# Patient Record
Sex: Female | Born: 1985 | Race: White | Hispanic: No | Marital: Single
Health system: Midwestern US, Community
[De-identification: ages and names within clinical notes are randomized; demographics above are authoritative.]

---

## 2010-02-23 ENCOUNTER — Emergency Department (HOSPITAL_BASED_OUTPATIENT_CLINIC_OR_DEPARTMENT_OTHER): Admission: EM | Admit: 2010-02-23 | Discharge: 2010-02-23 | Payer: Self-pay | Admitting: Emergency Medicine

## 2010-05-23 LAB — URINALYSIS WITH MICROSCOPIC
Bilirubin, Urine: NEGATIVE
CASTS 2: NONE SEEN /lpf
Casts UA: NONE SEEN /lpf
Crystals, UA: NONE SEEN
Glucose, Ur: NEGATIVE mg/dl
Ketones, Urine: 40 — AB
Leukocyte Esterase, Urine: NEGATIVE
MISCELLANEOUS 2: NONE SEEN
Nitrite, Urine: NEGATIVE
Renal Epithelial, UA: NONE SEEN
Specific Gravity, Urine: 1.029 (ref 1.002–1.030)
Urobilinogen, Urine: 0.2 eu/dl (ref 0.0–1.0)
Yeast, UA: NONE SEEN
pH, UA: 6 (ref 5.0–9.0)

## 2010-05-23 LAB — CBC
Basophils: 0.2 % (ref 0.0–3.0)
Eosinophils: 0.1 % (ref 0.0–4.0)
Hematocrit: 47.6 % — ABNORMAL HIGH (ref 37.0–47.0)
Hemoglobin: 16 gm/dl (ref 12.0–16.0)
Lymphocytes: 3.8 % — ABNORMAL LOW (ref 15.0–47.0)
MCH: 31.9 pg — ABNORMAL HIGH (ref 27.0–31.0)
MCHC: 33.7 gm/dl (ref 33.0–37.0)
MCV: 94.9 fL (ref 81.0–99.0)
MPV: 9.4 mcm (ref 7.4–10.4)
Monocytes: 4.3 % (ref 0.0–12.0)
Nucleated Red Blood Cells: 0 /100 wbc
Platelets: 254 10*3/uL (ref 130–400)
RBC Morphology: NORMAL
RBC: 5.02 10*6/uL (ref 4.20–5.40)
RDW: 13.5 % (ref 11.5–14.5)
Seg Neutrophils: 91.6 % — ABNORMAL HIGH (ref 43.0–75.0)
WBC: 13.5 10*3/uL — ABNORMAL HIGH (ref 4.8–10.8)

## 2010-05-23 LAB — BASIC METABOLIC PANEL WITHOUT CALCIUM
BUN, WHOLE BLOOD: 13 mg/dl (ref 7–22)
CO2, WHOLE BLOOD: 19 meq/l — ABNORMAL LOW (ref 23–33)
CREATININE, WHOLE BLOOD: 0.7 mg/dl (ref 0.5–1.2)
Chloride, Whole Blood: 107 meq/l (ref 98–111)
Glucose, Whole Blood: 111 mg/dl — ABNORMAL HIGH (ref 70–108)
Potassium, Whole Blood: 4.3 meq/l (ref 3.5–5.2)
Sodium, Whole Blood: 142 meq/l (ref 135–145)

## 2010-05-23 LAB — HEPATIC FUNCTION PANEL
ALT: 12 U/L (ref 11–66)
AST: 17 U/L (ref 12–45)
Albumin: 4.4 gm/dl (ref 3.5–5.1)
Alkaline Phosphatase: 77 U/L (ref 38–126)
Bilirubin, Direct: 0.2 mg/dl (ref 0.0–0.3)
Bilirubin, Total: 0.5 mg/dl (ref 0.3–1.2)
Total Protein: 7.2 gm/dl (ref 6.1–8.0)

## 2010-05-23 LAB — ANION GAP: Anion Gap: 19.7 (ref 10.0–20.0)

## 2010-05-23 LAB — OSMOLALITY: Osmolality Calc: 283 (ref 275–300)

## 2010-05-23 LAB — HCG, SERUM, QUALITATIVE: Preg, Serum: NEGATIVE

## 2010-05-24 LAB — GLOMERULAR FILTRATION RATE, ESTIMATED: Est, Glom Filt Rate: 89 mL/min/{1.73_m2} — AB

## 2010-05-24 LAB — COMPREHENSIVE METABOLIC PANEL
ALT: 10 U/L — ABNORMAL LOW (ref 11–66)
AST: 16 U/L (ref 12–45)
Albumin: 3.9 gm/dl (ref 3.5–5.1)
Alkaline Phosphatase: 68 U/L (ref 38–126)
BUN: 7 mg/dl (ref 7–22)
Bilirubin, Total: 0.4 mg/dl (ref 0.3–1.2)
CO2: 25 meq/l (ref 23–33)
Calcium: 8.9 mg/dl (ref 8.5–10.5)
Chloride: 113 meq/l — ABNORMAL HIGH (ref 98–111)
Creatinine: 0.8 mg/dl (ref 0.4–1.2)
Glucose: 91 mg/dl (ref 70–108)
Potassium: 4.1 meq/l (ref 3.5–5.2)
Sodium: 143 meq/l (ref 135–145)
Total Protein: 6.4 gm/dl (ref 6.1–8.0)

## 2010-05-24 LAB — CBC
Basophils: 0.2 % (ref 0.0–3.0)
Eosinophils: 0.6 % (ref 0.0–4.0)
Hematocrit: 39.8 % (ref 37.0–47.0)
Hemoglobin: 13.3 gm/dl (ref 12.0–16.0)
Lymphocytes: 27.4 % (ref 15.0–47.0)
MCH: 31.9 pg — ABNORMAL HIGH (ref 27.0–31.0)
MCHC: 33.3 gm/dl (ref 33.0–37.0)
MCV: 95.6 fL (ref 81.0–99.0)
MPV: 9.6 mcm (ref 7.4–10.4)
Monocytes: 11.9 % (ref 0.0–12.0)
Nucleated Red Blood Cells: 0 /100 wbc
Platelets: 187 10*3/uL (ref 130–400)
RBC Morphology: NORMAL
RBC: 4.16 10*6/uL — ABNORMAL LOW (ref 4.20–5.40)
RDW: 13.6 % (ref 11.5–14.5)
Seg Neutrophils: 59.9 % (ref 43.0–75.0)
WBC: 5.6 10*3/uL (ref 4.8–10.8)

## 2010-05-24 LAB — MAGNESIUM: Magnesium: 2.2 mg/dl (ref 1.6–2.4)

## 2010-09-21 LAB — PREGNANCY, URINE: Pregnancy, Urine: POSITIVE

## 2010-09-26 LAB — CHLAMYDIA CULTURE: CHLAMYDIA CULTURE: NEGATIVE

## 2010-09-26 LAB — CULTURE, GONOCOCCUS: Culture, Gonorrhoeae: NEGATIVE

## 2010-09-26 LAB — RPR: RPR: NONREACTIVE

## 2010-09-26 LAB — HIV SCREEN: HIV-1/HIV-2 Ab: NEGATIVE

## 2010-09-26 LAB — TYPE AND SCREEN

## 2010-09-26 LAB — HEPATITIS B SURFACE ANTIGEN OBSTETRIC PANEL: Hepatitis B Surface Ag: NEGATIVE

## 2010-09-26 LAB — RUBELLA ANTIBODY, IGG: Rubella Antibody, IgG: IMMUNE

## 2010-10-05 NOTE — L&D Delivery Note (Signed)
Department of Obstetrics and Gynecology  Spontaneous Vaginal Delivery Note      Pt Name: Jenny Ramirez  MRN: [id number] Acct #: [id number]  Date of Birth: [birth year]  Procedure Performed By: Burnard Leigh. Shahil Speegle, MD        Pre-operative Diagnosis:  Term pregnancy or Spontaneous labor    Post-operative Diagnosis: Same, delivered.    Procedure:  Spontaneous vaginal delivery    Surgeon:  Bea Graff    Information for the patient's newborn:  Amala, Petion [[id number]]   Sex: Female (Simultaneous filing. User may not have seen previous data.)  Birthweight: 6 lb 11.8 oz (3.055 kg)  One Minute Apgar: 9   Five Minute Apgar: 9       Anesthesia:  none    Estimated blood loss:     Specimen:  Placenta not sent to pathology     Complications:  none    Condition:  infant stable to general nursery and mother stable    Details of Procedure:   The patient is a 25 y.o. female at [redacted]w[redacted]d   OB History     Grav Para Term Preterm Abortions TAB SAB Ect Mult Living    2 1 1       1        who was admitted for latent labor. She received the following interventions: ARBOW and IV Pitocin augmentationThe patient progressed well,did receive an epidural, became complete and started to push. She was placed in the dorsal lithotomy position and prepped. She delivered the vertex over an intact perineum The nose and mouth were suctioned with bulb suction and the rest of the infant delivered atraumatically, placed on mother abdomen.Cord was clamped and cut and infant handed off to the waiting nurse for evaluation. The placenta delivered intact, whole and that the umbilical cord had three vessels noted. The perineum and vagina were explored and no lacerations were found   Delivery Summary:  Labor & Delivery Summary  Rupture Date: 05/09/11  Rupture Time: 0659    Boy apgars 9,9 6#11    Ren Grasse L. Lauretta Sallas

## 2010-10-09 ENCOUNTER — Inpatient Hospital Stay: Admit: 2010-10-09 | Discharge: 2010-10-11 | Disposition: A | Discharge status: Home / Self Care

## 2010-10-09 LAB — ANION GAP: Anion Gap: 16.5 (ref 10.0–20.0)

## 2010-10-09 LAB — BASIC METABOLIC PANEL WITHOUT CALCIUM
BUN, WHOLE BLOOD: 9 mg/dl (ref 7–22)
CO2, WHOLE BLOOD: 20 meq/l — ABNORMAL LOW (ref 23–33)
CREATININE, WHOLE BLOOD: 0.5 mg/dl (ref 0.5–1.2)
Chloride, Whole Blood: 107 meq/l (ref 98–111)
Glucose, Whole Blood: 96 mg/dl (ref 70–108)
Potassium, Whole Blood: 3.9 meq/l (ref 3.5–5.2)
Sodium, Whole Blood: 138 meq/l (ref 135–145)

## 2010-10-09 LAB — HEPATIC FUNCTION PANEL
ALT: 14 U/L (ref 11–66)
AST: 14 U/L (ref 12–45)
Albumin: 4.5 gm/dl (ref 3.5–5.1)
Alkaline Phosphatase: 57 U/L (ref 38–126)
Bilirubin, Direct: 0.1 mg/dl (ref 0.0–0.3)
Bilirubin, Total: 0.4 mg/dl (ref 0.3–1.2)
Total Protein: 7.4 gm/dl (ref 6.1–8.0)

## 2010-10-09 LAB — CBC
Basophils: 0.1 % (ref 0.0–3.0)
Eosinophils: 0.1 % (ref 0.0–4.0)
Hematocrit: 41.8 % (ref 37.0–47.0)
Hemoglobin: 14.2 gm/dl (ref 12.0–16.0)
Lymphocytes: 7.6 % — ABNORMAL LOW (ref 15.0–47.0)
MCH: 32.2 pg — ABNORMAL HIGH (ref 27.0–31.0)
MCHC: 34 gm/dl (ref 33.0–37.0)
MCV: 94.6 fL (ref 81.0–99.0)
MPV: 10.1 mcm (ref 7.4–10.4)
Monocytes: 4.9 % (ref 0.0–12.0)
Nucleated Red Blood Cells: 0 /100 wbc
Platelets: 202 10*3/uL (ref 130–400)
RBC Morphology: NORMAL
RBC: 4.42 10*6/uL (ref 4.20–5.40)
RDW: 13.5 % (ref 11.5–14.5)
Seg Neutrophils: 87.3 % — ABNORMAL HIGH (ref 43.0–75.0)
WBC: 12 10*3/uL — ABNORMAL HIGH (ref 4.8–10.8)

## 2010-10-09 LAB — LIPASE: Lipase: 35 U/L (ref 5.6–51.3)

## 2010-10-09 LAB — OSMOLALITY: Osmolality Calc: 274 — ABNORMAL LOW (ref 275–300)

## 2010-12-22 LAB — URINALYSIS, ROUTINE W REFLEX MICROSCOPIC
Ketones, ur: NEGATIVE mg/dL
Leukocytes, UA: NEGATIVE
Nitrite: NEGATIVE
Protein, ur: 30 mg/dL — AB
pH: 7 (ref 5.0–8.0)

## 2010-12-22 LAB — URINE MICROSCOPIC-ADD ON

## 2010-12-22 LAB — BASIC METABOLIC PANEL
BUN: 7 mg/dL (ref 6–23)
Calcium: 9.5 mg/dL (ref 8.4–10.5)
Creatinine, Ser: 1 mg/dL (ref 0.4–1.2)
GFR calc non Af Amer: >60 mL/min (ref >60)
Glucose, Bld: 83 mg/dL (ref 70–99)
Potassium: 4.1 mEq/L (ref 3.5–5.1)

## 2010-12-22 LAB — CBC
HCT: 39.6 % (ref 36.0–46.0)
Platelets: 252 10*3/uL (ref 150–400)
RDW: 11.6 % (ref 11.5–15.5)
WBC: 8.6 10*3/uL (ref 4.0–10.5)

## 2010-12-22 LAB — DIFFERENTIAL
Basophils Absolute: 0.1 10*3/uL (ref 0.0–0.1)
Eosinophils Relative: 1 % (ref 0–5)
Lymphocytes Relative: 39 % (ref 12–46)
Neutrophils Relative %: 53 % (ref 43–77)

## 2011-03-27 ENCOUNTER — Observation Stay: Admit: 2011-03-27 | Disposition: A | Discharge status: Home / Self Care

## 2011-03-27 LAB — URINE WITH REFLEXED MICRO
Bilirubin Urine: NEGATIVE
CASTS 2: NONE SEEN /lpf
Casts UA: NONE SEEN /lpf
Crystals, UA: NONE SEEN
Glucose, Ur: NEGATIVE mg/dl
Ketones, Urine: NEGATIVE
Leukocyte Esterase, Urine: NEGATIVE
MISCELLANEOUS 2: NONE SEEN
Nitrite, Urine: NEGATIVE
Protein, UA: NEGATIVE
Renal Epithelial, UA: NONE SEEN
Specific Gravity, Urine: 1.019 (ref 1.002–1.030)
Urobilinogen, Urine: 0.2 eu/dl (ref 0.0–1.0)
Yeast, UA: NONE SEEN
pH, UA: 6.5 (ref 5.0–9.0)

## 2011-03-27 LAB — FETAL FIBRONECTIN: Fetal Fibronectin: NEGATIVE

## 2011-03-27 NOTE — Discharge Instructions (Signed)
Labor and Delivery Education for Discharge [[facility]]      Reminder to Patient   Please bring all teaching sheets and discharge information with you if you return to the hospital or the physician's office/clinic for follow-up care. If you have any questions, please call: "Call-A-Nurse" [phone] or [phone].    Call Your Doctor if Any of the Following Occurs   1. Leaking fluid from vagina with or without contractions  2. Bright red vaginal bleeding occurs that is as heavy as or heavier than a period  3. Regular contractions are longer, stronger, and closer together  4. Noticeable decreased fetal movement  5. Elevated temperature >100.5F and chills  6. Blurred vision, spots before eyes, unrelievable headache, severe facial swelling, or upper abdominal pain  Contact physician or hospital OB unit if signs of premature labor occur at ?36 weeks  1. Persistent or rhythmic low back pain that feels different than you are used to  2. Menstrual like cramps  3. Intestinal cramps with or without diarrhea  4. Pelvic pressure or rhythmic tightening that feels different than you are used to  5. Watery discharge or a gush of fluid from your vagina  6. Vaginal bleeding as heavy as a period  General Information     Difference between:  False Labor True Labor   1. Contractions often are irregular and don't consistently get closer together (called Braxton-Hicks contractions) 1. Contractions come at regular intervals and, as time goes on, get closer together.   2. Contractions may often stop when you rest or with a position change. 2. Contractions continue despite movement or walking. Contractions get stronger and closer together with time.   3. Often felt in the lower abdomen. 3. Usually felt in back coming around to the front.     Reviewed Dates   02/13/2006  Document developed by   Labor and Delivery Discharge Education Form  Disclaimer: We want you to understand more clearly each of the health conditions and procedures you  may have. This patient leaflet is a summary of useful information to help you gain a better understanding of these health topics. Other information about this condition or procedure may be important for you to know. Please talk with your healthcare provider for more information about your special health needs.

## 2011-03-28 NOTE — Progress Notes (Signed)
[facility]                                    [place], [place]                                 FETAL MONITORING REPORT     PATIENT NAME: Jenny Ramirez, Jenny Ramirez.            DOB:  1986-05-19  MEDICAL RECORD NO: [id number]                 ROOM: 5C 0002  ACCOUNT NO: [id number]                          DATE: 03/27/2011  PHYSICIAN: Lynnell Catalan, M.D.        Fetal monitoring was performed.  It showed good beat-to-beat variability,  good reactivity.     IMPRESSION of reactive fetal monitor strip was made.              Lynnell Catalan, M.D.           D: 03/28/2011 13:34                                    T: 03/28/2011 19:15  rm     CC:  Lynnell Catalan, M.D.  [address]  [place], [place] [address]

## 2011-04-11 ENCOUNTER — Inpatient Hospital Stay: Admit: 2011-04-11 | Discharge: 2011-04-11 | Disposition: A | Discharge status: Home / Self Care

## 2011-04-11 NOTE — ED Provider Notes (Signed)
Patient is a 25 y.o. female presenting with pharyngitis. The history is provided by the patient.   Pharyngitis  This is a new problem. The current episode started more than 2 days ago. The problem occurs constantly. The problem has not changed since onset.Pertinent negatives include no chest pain, no abdominal pain, no headaches and no shortness of breath. The symptoms are aggravated by swallowing. The symptoms are relieved by nothing. She has tried water for the symptoms.       Review of Systems   Constitutional: Negative.  Negative for fever.   HENT: Positive for sore throat.    Eyes: Negative.    Respiratory: Positive for cough. Negative for shortness of breath.    Cardiovascular: Negative.  Negative for chest pain.   Gastrointestinal: Negative.  Negative for abdominal pain.   Genitourinary: Negative.    Musculoskeletal: Negative.    Skin: Negative.    Neurological: Negative.  Negative for headaches.   Hematological: Negative.    Psychiatric/Behavioral: Negative.        Physical Exam   Nursing note and vitals reviewed.  Constitutional: She is oriented to person, place, and time. She appears well-developed and well-nourished.   HENT:   Head: Normocephalic and atraumatic.   Right Ear: External ear normal.   Left Ear: External ear normal.   Nose: Nose normal.   Mouth/Throat: Oropharynx is clear and moist.        Positive mucous PND   Eyes: Conjunctivae are normal. Pupils are equal, round, and reactive to light. Right eye exhibits no discharge. Left eye exhibits no discharge.   Neck: Normal range of motion. Neck supple.   Cardiovascular: Normal rate, regular rhythm, normal heart sounds and intact distal pulses.    No murmur heard.  Pulmonary/Chest: Effort normal and breath sounds normal. No respiratory distress. She has no wheezes. She has no rales. She exhibits no tenderness.   Abdominal: Soft. Bowel sounds are normal. She exhibits no mass. There is no tenderness.   Musculoskeletal: Normal range of motion.    Lymphadenopathy:     She has no cervical adenopathy.   Neurological: She is alert and oriented to person, place, and time.   Skin: Skin is warm and dry. No rash noted.   Psychiatric: She has a normal mood and affect. Her behavior is normal. Judgment and thought content normal.       Procedures    MDM    Labs      Radiology      EKG Interpretation.        Cordie Grice, CNP  04/11/11 1555

## 2011-04-11 NOTE — ED Notes (Signed)
Chart reviewed and findings by lpn accepted.     Harold Hedge, LPN  45/40/98 1191    Shawn Valrie Hart, RN  04/11/11 1625

## 2011-04-11 NOTE — Discharge Instructions (Signed)
Common Cold, Adult  An upper respiratory tract infection, or cold, is a viral infection of the air passages to the lung. Colds are contagious, especially during the first 3 or 4 days. Antibiotics cannot cure a cold. Cold germs are spread by coughs, sneezes, and hand to hand contact. A respiratory tract infection usually clears up in a few days, but some people may be sick for a week or two.  HOME CARE INSTRUCTIONS   Only take over-the-counter or prescription medicines for pain, discomfort, or fever as directed by your caregiver.    Be careful not to blow your nose too hard. This may cause a nosebleed.    Use a cool-mist humidifier (vaporizer) to increase air moisture. This will make it easier for you to breath. Do not use hot steam.    Rest as much as possible and get plenty of sleep.    Wash your hands often, especially after you blow your nose. Cover your mouth and nose with a tissue when you sneeze or cough.    Drink at least 8 glasses of clear liquids every day, such as water, fruit juices, tea, clear soups, and carbonated beverages.   SEEK MEDICAL CARE IF:   An oral temperature above 102 F (38.9 C) develops, or as your caregiver suggests, which lasts 2 days or more, and is not controlled by medication.    You have a sore throat that gets worse or you see white or yellow spots in your throat.    Your cough gets worse or lasts more than 10 days.    You have a rash somewhere on your skin. You have large and tender lumps in your neck.    You have an earache or a headache.    You have thick, greenish or yellowish discharge from your nose.    You cough-up thick yellow, green, gray or bloody mucus (secretions).   SEEK IMMEDIATE MEDICAL CARE IF:  You have trouble breathing, chest pain, or your skin or nails look gray or blue.  MAKE SURE YOU:    Understand these instructions.    Will watch your condition.    Will get help right away if you are not doing well or get worse.   Document Released:  01/10/2003 Document Re-Released: 09/03/2008  [facility] Patient Information 2012 [place].    Antibiotic Non-Use,  Why You Were Not Given One  You or your child was seen today. Your caregiver felt that the infection or problem was not one that would be helped with an antibiotic.    Infections may be caused by viruses or bacteria. Only a caregiver can tell which one of these is the likely cause of an illness. A cold is the most common cause of infection in both adults and children. A cold is a virus. Antibiotic treatment will have no effect on a viral infection. Viruses can lead to many lost days of work caring for sick children and many missed days of school. Children may catch as many as 10 "colds" or "flus" per year during which they can be tearful, cranky and uncomfortable. The goal of treating a virus is aimed at keeping the ill person comfortable.  Antibiotics are medications used to help the body fight bacterial infections. There are relatively few types of bacteria that cause infections but there are hundreds of viruses. While both viruses and bacteria cause infection they are very different types of germs. A viral infection will typically go away by itself within 7 to 10  days. Bacterial infections may spread or get worse without antibiotic treatment.  Examples of bacterial infections are:   Sore throats (like strep throat or tonsillitis).    Infection in the lung (pneumonia).    Ear and skin infections.   Examples of viral infections are:   Colds or flus.    Most coughs and bronchitis.    Sore throats not caused by Strep.    Runny noses.   It is often best not to take an antibiotic when a viral infection is the cause of the problem. Antibiotics can kill off the helpful bacteria that we have inside our body and allow harmful bacteria to start growing. Antibiotics can cause side effects such as allergies, nausea, and diarrhea without helping to improve the symptoms of the viral infection.  Additionally, repeated uses of antibiotics can cause bacteria inside of our body to become resistant. That resistance can be passed onto harmful bacterial. The next time you have an infection it may be harder to treat if antibiotics are used when they are not needed. Not treating with antibiotics allows our own immune system to develop and take care of infections more efficiently. Also, antibiotics will work better for [place] when they are prescribed for bacterial infections.  Treatments for a child that is ill may include:   Give extra fluids throughout the day to stay hydrated.    Get plenty of rest.    Only give your child over-the-counter or prescription medicines for pain, discomfort, or fever as directed by your caregiver.    The use of a cool mist humidifier may help stuffy noses.    Cold medications if suggested by your caregiver.   Your caregiver may decide to start you on an antibiotic if:   The problem you were seen for today continues for a longer length of time than expected.    You develop a secondary bacterial infection.   SEEK MEDICAL CARE IF   Fever lasts longer than 5 days.    Symptoms continue to get worse after 5 - 7 days or become severe.    Difficulty in breathing develops.    Signs of dehydration develop (poor drinking, rare urinating, dark colored urine).    Changes in behavior or worsening tiredness (listlessness or lethargy).   Document Released: 11/30/2001 Document Re-Released: 07/19/2009  [facility] Patient Information 2012 [place].    Salt Water Gargle  This solution will help make your mouth and throat feel better.  HOME CARE INSTRUCTIONS   Mix 1 teaspoon (4.9cc) of salt in 8 ounces (236 ml) of warm water.    Gargle with this solution as much or often as you need or as directed. Swish and gargle gently if you have any sores or wounds in your mouth.    Do not swallow this mixture.   Document Released: 06/25/2004 Document Re-Released: 12/16/2009  [facility] Patient  Information 2012 [place].

## 2011-04-19 ENCOUNTER — Observation Stay: Admit: 2011-04-19 | Disposition: A | Discharge status: Home / Self Care

## 2011-04-19 LAB — CULTURE, STREP B SCREEN, VAGINAL/RECTAL: Group B Strep Culture: POSITIVE

## 2011-04-19 MED ORDER — ACETAMINOPHEN 325 MG PO TABS
325 | ORAL | Status: DC | PRN
Start: 2011-04-19 — End: 2011-04-19

## 2011-04-19 MED ORDER — ONDANSETRON HCL 4 MG/2ML IJ SOLN
4 | Freq: Four times a day (QID) | INTRAMUSCULAR | Status: DC | PRN
Start: 2011-04-19 — End: 2011-04-19

## 2011-04-19 MED ORDER — ACETAMINOPHEN 325 MG PO TABS
325 | ORAL | Status: DC | PRN
Start: 2011-04-19 — End: 2014-05-12

## 2011-04-19 MED ORDER — ONDANSETRON HCL 4 MG/2ML IJ SOLN
4 | Freq: Four times a day (QID) | INTRAMUSCULAR | Status: DC | PRN
Start: 2011-04-19 — End: 2014-05-12

## 2011-04-19 NOTE — Other (Signed)
G2 P1 admitted to [facility] with cramping, pain in lower abdomen, and sharp occasional pain in upper abdomen.  Pt states she has felt this way since yesterday (7/14) at 1200.  V/E done.  Cervix closed and soft.  PP floating.  GBS culture done and sent to lab.

## 2011-04-19 NOTE — Other (Signed)
Pt discharge instructions given and explained with pt verbalized understanding. Stated to do not use any over the counter medications for bowel movement relief until spoken to primary physician and advised to call office in A.M. If no bowel movement, pt verbalized understanding with no further questions.

## 2011-04-19 NOTE — Discharge Instructions (Signed)
Home Undelivered Discharge Instructions    After Discharge Orders:               Diet:  normal diet as tolerated     Rest: normal activity as tolerated    Other instructions: Do kick counts once a day on your baby. Choose the time of day your baby is most active. Get in a comfortable lying or sitting position and time how long it takes to feel 10 kicks, twists, turns, swishes, or rolls. Call your physician or midwife if there have not been 10 kicks in 1 hours    Call physician or midwife, return to Labor and Delivery, call 911, or go to the nearest Emergency Room if: increased leakage or fluid, contractions more than  5 per  20 minutes, decreased fetal movement, persistent low back pain or cramping, bleeding from vaginal area, difficulty urinating, pain with urination, difficulty breathing, new calf pain, persistent headache or vision change          Labor and Delivery Education for Discharge [[facility]]      Reminder to Patient   Please bring all teaching sheets and discharge information with you if you return to the hospital or the physician's office/clinic for follow-up care. If you have any questions, please call: "Call-A-Nurse" [phone] or [phone].    Call Your Doctor if Any of the Following Occurs   1. Leaking fluid from vagina with or without contractions  2. Bright red vaginal bleeding occurs that is as heavy as or heavier than a period  3. Regular contractions are longer, stronger, and closer together  4. Noticeable decreased fetal movement  5. Elevated temperature >100.60F and chills  6. Blurred vision, spots before eyes, unrelievable headache, severe facial swelling, or upper abdominal pain  Contact physician or hospital OB unit if signs of premature labor occur at ?36 weeks  1. Persistent or rhythmic low back pain that feels different than you are used to  2. Menstrual like cramps  3. Intestinal cramps with or without diarrhea  4. Pelvic pressure or rhythmic tightening that feels different than  you are used to  5. Watery discharge or a gush of fluid from your vagina  6. Vaginal bleeding as heavy as a period  General Information     Difference between:  False Labor True Labor   1. Contractions often are irregular and don't consistently get closer together (called Braxton-Hicks contractions) 1. Contractions come at regular intervals and, as time goes on, get closer together.   2. Contractions may often stop when you rest or with a position change. 2. Contractions continue despite movement or walking. Contractions get stronger and closer together with time.   3. Often felt in the lower abdomen. 3. Usually felt in back coming around to the front.     Reviewed Dates   02/13/2006  Document developed by   Labor and Delivery Discharge Education Form  Disclaimer: We want you to understand more clearly each of the health conditions and procedures you may have. This patient leaflet is a summary of useful information to help you gain a better understanding of these health topics. Other information about this condition or procedure may be important for you to know. Please talk with your healthcare provider for more information about your special health needs.

## 2011-04-23 NOTE — Progress Notes (Signed)
[facility]                                    [place], [place]                                     OUTPATIENT NOTE     PATIENT NAME: Jenny Ramirez, Jenny Ramirez.            DOB:  January 23, 1986  MEDICAL RECORD NO: [id number]                 ROOM: 5C 0006  ACCOUNT NO: [id number]                          DATE: 04/19/2011  PHYSICIAN: Lorin Picket C. Enijah Furr, M.D.        NOTE:  The patient presented to Labor and Delivery at 35 weeks complaining of  contractions, she was found to be in false labor, she had a nonstress test  that was read as reactive.                 Charlayne Vultaggio C. Catalina Lunger, M.D.           D: 04/23/2011 16:27                                    T: 04/24/2011 02:20  js     CC:  Nehemias Sauceda C. Catalina Lunger, M.D.              Lynnell Catalan, M.D.  [address]                  [address]  [place], [place] [address]                        [place], [place] [address]

## 2011-05-08 NOTE — Other (Signed)
Contr. toco adjusted and rezeroed.

## 2011-05-08 NOTE — H&P (Signed)
Jenny Ramirez is a 25 y.o. female patient.  No diagnosis found.  Past Medical History   Diagnosis Date   . Abnormal Pap smear 2010     OB History     Grav Para Term Preterm Abortions TAB SAB Ect Mult Living    2 1 1       1         [redacted]w[redacted]d  Estimated Date of Delivery: 05/17/11  No Known Allergies  Active Problems:   * No active hospital problems. *     Blood pressure 129/59, pulse 87, temperature 97.8 F (36.6 C), temperature source Oral, resp. rate 18, height 5\' 7"  (1.702 m), weight 212 lb (96.163 kg), SpO2 96.00%, not currently breastfeeding.    Maternal Medical History:   Reason for admission: Reason for admission: contractions.   Contractions: Onset was 13-24 hours ago.    Frequency: irregular.    Perceived severity is mild.      Fetal activity: Perceived fetal activity is normal.    Last perceived fetal movement was within the past hour.          Maternal Exam:   Uterine Assessment: Contraction strength is mild.  Contraction frequency is regular.   q54min    Abdomen: Patient reports no abdominal tenderness. Fetal presentation: vertex    Introitus: Normal vulva. Normal vagina.  Pelvis: adequate for delivery.    Cervix: Cervix evaluated by digital exam.    3/70/-3 from 2 earlier    Fetal Exam  Fetal Monitor Review: Mode: ultrasound.    Variability: moderate (6-25 bpm).    Pattern: accelerations present.      Fetal State Assessment: Category I - tracings are normal.          Assessment:  Early latent labor.   Membrane status: intact.   Early labor    Plan:  Anticipate svd.      Rylend Pietrzak L. Christina Waldrop  05/08/2011

## 2011-05-08 NOTE — Other (Signed)
EFM BACK ON AND FEELING CONTR.   STATES THE CONTR. PAIN WHEN IT COMES IS 7,8, AND SOMETIMES 9.

## 2011-05-08 NOTE — Other (Signed)
Fetal monitor strip noted from admission to 2019 to be missing from fetal monitor cart. Rn approached pt about missing strip pt denies taking or removing strip.  When in pt's closet, closed strip noted to be in pts purse witnessed by second RN A Production designer, theatre/television/filmeely RN.  When pt asked again about strip continues to deny having strip. Informed pt is important part of her chart if finds to return to nurse.

## 2011-05-08 NOTE — Other (Signed)
Dr Fawn KirkV Stallkamp on unit updated on pt ve 3/70/-2 intact, contractions every 5-7 minutes bloody show noted with ve orders received

## 2011-05-08 NOTE — Other (Signed)
CONTR. TOCO ADJUSTED.

## 2011-05-08 NOTE — Other (Signed)
EFM OFF AND FETAL STRIP SHOWN TO. [name] AND SHE STATES TO ABMULATE TO OBSERVE FOR LABOR.  REACTIVE TRACING.

## 2011-05-08 NOTE — Other (Signed)
In room 5c05 in bed.   States has been having contr. All day long.  States came in by wheelchair and significant other father of baby Fritzi Mandes and son at side.  In gown and up to scale for weight.  Photo ID and insurance card to admitting.  States fetal movement has been good today maybe just a little less and membranes intact with no leaking of water vaginally just some mucous today.  Discussed vaginal exam after  Fetal monitor placed.  Dr. Fawn Kirk is in the labor and delivery department.

## 2011-05-08 NOTE — Other (Signed)
EFM OFF AND ASSISTED TO THE BATHROOM TO VOID.  FETAL HEART TONES AUDIBLE AT 150.

## 2011-05-09 ENCOUNTER — Inpatient Hospital Stay: Admit: 2011-05-09 | Disposition: A | Discharge status: Home / Self Care

## 2011-05-09 DIAGNOSIS — IMO0001 Reserved for inherently not codable concepts without codable children: Secondary | ICD-10-CM

## 2011-05-09 LAB — RPR: RPR: NONREACTIVE

## 2011-05-09 LAB — CBC
Hematocrit: 36 % — ABNORMAL LOW (ref 37.0–47.0)
Hemoglobin: 12.5 gm/dl (ref 12.0–16.0)
MCH: 32.9 pg — ABNORMAL HIGH (ref 27.0–31.0)
MCHC: 34.8 gm/dl (ref 33.0–37.0)
MCV: 94.4 fL (ref 81.0–99.0)
MPV: 10.3 mcm (ref 7.4–10.4)
Platelets: 210 10*3/uL (ref 130–400)
RBC: 3.81 10*6/uL — ABNORMAL LOW (ref 4.20–5.40)
RDW: 13.5 % (ref 11.5–14.5)
WBC: 13.7 10*3/uL — ABNORMAL HIGH (ref 4.8–10.8)

## 2011-05-09 MED ORDER — LIDOCAINE HCL (PF) 1 % IJ SOLN
1 | INTRAMUSCULAR | Status: DC | PRN
Start: 2011-05-09 — End: 2011-05-09

## 2011-05-09 MED ORDER — CARBOPROST TROMETHAMINE 250 MCG/ML IM SOLN
250 | Freq: Once | INTRAMUSCULAR | Status: AC | PRN
Start: 2011-05-09 — End: 2011-05-09

## 2011-05-09 MED ADMIN — lactated ringers infusion: INTRAVENOUS | @ 14:00:00 | NDC 00338011704

## 2011-05-09 MED ADMIN — penicillin G potassium 5 Million Units in dextrose 5 % 100 mL IVPB: INTRAVENOUS | @ 02:00:00 | NDC 00338055118

## 2011-05-09 MED ADMIN — lactated ringers infusion: INTRAVENOUS | @ 01:00:00 | NDC 00338011704

## 2011-05-09 MED ADMIN — lactated ringers infusion: INTRAVENOUS | @ 10:00:00 | NDC 00338011704

## 2011-05-09 MED ADMIN — oxytocin (PITOCIN) 30 units in LR 500mL: INTRAVENOUS | @ 09:00:00 | NDC 09999990087

## 2011-05-09 MED ADMIN — penicillin G potassium 2.5 Million Units in dextrose 5 % 100 mL IVPB: INTRAVENOUS | @ 07:00:00 | NDC 09999990288

## 2011-05-09 MED ADMIN — fentaNYL 750 mcg, ropivacaine 0.1% in sodium chloride 0.9% 265mL (OB) epidural: EPIDURAL | @ 13:00:00 | NDC 09999990254

## 2011-05-09 MED ADMIN — promethazine (PHENERGAN) injection 50 mg: INTRAMUSCULAR | @ 05:00:00 | NDC 00641092821

## 2011-05-09 MED ADMIN — meperidine (DEMEROL) injection 50 mg: INTRAMUSCULAR | @ 05:00:00 | NDC 00409117830

## 2011-05-09 MED ADMIN — penicillin G potassium 2.5 Million Units in dextrose 5 % 100 mL IVPB: INTRAVENOUS | @ 15:00:00 | NDC 09999990288

## 2011-05-09 MED ADMIN — penicillin G potassium 2.5 Million Units in dextrose 5 % 100 mL IVPB: INTRAVENOUS | @ 11:00:00 | NDC 09999990288

## 2011-05-09 MED ADMIN — ibuprofen (ADVIL;MOTRIN) tablet 800 mg: 800 mg | ORAL | @ 20:00:00 | NDC 62584074811

## 2011-05-09 MED FILL — PENICILLIN G POTASSIUM 5000000 UNITS IJ SOLR: 5000000 units | INTRAMUSCULAR | Qty: 2.5

## 2011-05-09 MED FILL — ONDANSETRON HCL 4 MG PO TABS: 4 MG | ORAL | Qty: 2

## 2011-05-09 MED FILL — PENICILLIN G POTASSIUM 5000000 UNITS IJ SOLR: 5000000 [IU] | INTRAMUSCULAR | Qty: 2.5

## 2011-05-09 MED FILL — DEMEROL 50 MG/ML IJ SOLN: 50 MG/ML | INTRAMUSCULAR | Qty: 1

## 2011-05-09 MED FILL — PROMETHAZINE HCL 25 MG/ML IJ SOLN: 25 mg/mL | INTRAMUSCULAR | Qty: 2

## 2011-05-09 MED FILL — IBUPROFEN 800 MG PO TABS: 800 MG | ORAL | Qty: 1

## 2011-05-09 MED FILL — FERROUS SULFATE 325 (65 FE) MG PO TABS: 325 (65 Fe) MG | ORAL | Qty: 1

## 2011-05-09 MED FILL — PENICILLIN G POTASSIUM 5000000 UNITS IJ SOLR: 5000000 units | INTRAMUSCULAR | Qty: 5

## 2011-05-09 NOTE — Other (Signed)
Dr Fawn Kirk in room to get update on pt.

## 2011-05-09 NOTE — Other (Signed)
Dr Hetty Ely here for epidural.  "Time out" done with Dr Hetty Ely.  Pt sitting up for epidural - toco off for procedure.

## 2011-05-09 NOTE — Other (Signed)
Up to void qs.  Peri care completed.  To M/B per wheelchair with infant and family.

## 2011-05-09 NOTE — Other (Signed)
Up to bathroom, voided. Iv discontinued

## 2011-05-09 NOTE — Anesthesia Pre-Procedure Evaluation (Signed)
Jenny Ramirez  [facility]  PRE-ANESTHESIA EVALUATION FORM       Name:  Jenny Ramirez                                         Age:  25 y.o.  MRN:  [id number]           No Known Allergies  Patient Active Problem List   Diagnoses   . Active labor     Past Medical History   Diagnosis Date   . Abnormal Pap smear 2010     Past Surgical History   Procedure Date   . Knee surgery 2009   . Wisdom tooth extraction 2004   . Fracture surgery      Reconstruction of Right knee cap     History   Substance Use Topics   . Smoking status: Current Some Day Smoker -- 0.2 packs/day for .5 years     Types: Cigarettes   . Smokeless tobacco: Not on file   . Alcohol Use: No     Medications  Current Facility-Administered Medications on File Prior to Encounter   Medication Dose Route Frequency Provider Last Rate Last Dose   . acetaminophen (TYLENOL) tablet 650 mg  650 mg Oral Q4H PRN Nadara Mode, MD       . ondansetron (ZOFRAN) injection 4 mg  4 mg Intravenous Q6H PRN Nadara Mode, MD         Current Outpatient Prescriptions on File Prior to Encounter   Medication Sig Dispense Refill   . prenatal vitamin (PRENATAL-S) 27-0.8 MG TABS Take 1 tablet by mouth daily.           Current Facility-Administered Medications   Medication Dose Route Frequency Provider Last Rate Last Dose   . lactated ringers infusion   Intravenous Continuous Derrill Kay, MD 125 mL/hr at 05/09/11 0541     . lidocaine 1 % injection 30 mL  30 mL Other PRN Derrill Kay, MD       . nalbuphine (NUBAIN) injection 10 mg  10 mg Intravenous Q1H PRN Derrill Kay, MD       . butorphanol (STADOL) injection 1 mg  1 mg Intravenous Q1H PRN Derrill Kay, MD       . acetaminophen (TYLENOL) tablet 650 mg  650 mg Oral Q4H PRN Derrill Kay, MD       . ibuprofen (ADVIL;MOTRIN) tablet 800 mg  800 mg Oral Q8H PRN Derrill Kay, MD       . ketorolac (TORADOL) injection 30 mg  30 mg Intravenous Q6H PRN Derrill Kay, MD        . HYDROcodone-acetaminophen (VICODIN) 5-500 MG per tablet 1 tablet  1 tablet Oral Q4H PRN Derrill Kay, MD       . oxyCODONE-acetaminophen (PERCOCET) 5-325 MG per tablet 2 tablet  2 tablet Oral Q4H PRN Derrill Kay, MD       . ondansetron (ZOFRAN) injection 8 mg  8 mg Intravenous Q8H PRN Derrill Kay, MD       . oxytocin (PITOCIN) 30 units in LR  2 milli-units/min Intravenous Continuous Derrill Kay, MD 10 mL/hr at 05/09/11 0712 10 milli-units/min at 05/09/11 4098   . diphenhydrAMINE (BENADRYL) tablet 25 mg  25 mg Oral Q4H PRN Derrill Kay, MD       .  diphenhydrAMINE (BENADRYL) injection 25 mg  25 mg Intravenous Q4H PRN Derrill Kay, MD       . oxytocin (PITOCIN) 30 units in LR  1 milli-units/min Intravenous Continuous Derrill Kay, MD       . methylergonovine (METHERGINE) injection 200 mcg  200 mcg Intramuscular PRN Derrill Kay, MD       . carboprost (HEMABATE) injection 250 mcg  250 mcg Intramuscular PRN Derrill Kay, MD       . oxytocin (PITOCIN) 30 units in LR  1 milli-units/min Intravenous Continuous Derrill Kay, MD       . witch hazel-glycerin (TUCKS) pad   Topical PRN Derrill Kay, MD       . benzocaine-menthol (DERMOPLAST) 20-0.5 % spray   Topical PRN Derrill Kay, MD       . penicillin G potassium 2.5 Million Units in dextrose 5 % 100 mL IVPB  2.5 Million Units Intravenous Q4H Derrill Kay, MD   2.5 Million Units at 05/09/11 0630     Facility-Administered Medications Ordered in Other Encounters   Medication Dose Route Frequency Provider Last Rate Last Dose   . acetaminophen (TYLENOL) tablet 650 mg  650 mg Oral Q4H PRN Nadara Mode, MD       . ondansetron (ZOFRAN) injection 4 mg  4 mg Intravenous Q6H PRN Nadara Mode, MD         Vitals   Filed Vitals:    05/09/11 0800   BP: 121/74   Pulse: 89   Temp:    Resp:      BP Readings from Last 3 Encounters:   05/09/11 121/74   04/19/11 102/73    04/11/11 143/87     BMI  Ht Readings from Last 1 Encounters:   05/08/11 5\' 7"  (1.702 m)     Wt Readings from Last 1 Encounters:   05/08/11 212 lb (96.163 kg)     Body mass index is 33.20 kg/(m^2).  Estimated Body mass index is 33.20 kg/(m^2) as calculated from the following:    Height as of this encounter: 5\' 7" (1.702 m).    Weight as of this encounter: 212 lb(96.163 kg).    CBC   Lab Results   Component Value Date    WBC 13.7 05/08/2011    WBC 12.0 10/09/2010    RBC 3.81 05/08/2011    RBC 4.42 10/09/2010    HGB 12.5 05/08/2011    HCT 36.0 05/08/2011    MCV 94.4 05/08/2011    RDW 13.5 05/08/2011    PLT 210 05/08/2011     CMP    Lab Results   Component Value Date    NA 138 10/09/2010    NA 143 05/24/2010    K 3.9 10/09/2010    K 4.1 05/24/2010    CL 113 05/24/2010    CO2 25 05/24/2010    BUN 7 05/24/2010    CREATININE 0.8 05/24/2010    LABGLOM 89 05/24/2010    GLUCOSE 91 05/24/2010    PROT 7.4 10/09/2010    CALCIUM 8.9 05/24/2010    BILITOT 0.4 10/09/2010    ALKPHOS 57 10/09/2010    AST 14 10/09/2010    ALT 14 10/09/2010     BMP    Lab Results   Component Value Date    NA 138 10/09/2010    NA 143 05/24/2010    K 3.9 10/09/2010    K 4.1 05/24/2010    CL 113 05/24/2010  CO2 25 05/24/2010    BUN 7 05/24/2010    CREATININE 0.8 05/24/2010    CALCIUM 8.9 05/24/2010    LABGLOM 89 05/24/2010    GLUCOSE 91 05/24/2010     POCGlucose  No results found for this basename: GLUCOSE:3 in the last 72 hours   Coags    No results found for this basename: PROTIME, INR, APTT     HCG (If Applicable)   Lab Results   Component Value Date    PREGTESTUR POSITIVE 09/21/2010    PREGSERUM NEGATIVE 05/23/2010      ABGs   No results found for this basename: PHART, PO2ART, PCO2ART, HCO3ART, BEART, O2SATART      Type & Screen (If Applicable)  Lab Results   Component Value Date    LABRH Rh Positive 09/26/2010     EKG (If Applicable)   CXR (If Applicable)   Cardiac Testing (If Applicable)    Anesthesia Evaluation      Airway   Mallampati: II  TM distance: >3 FB  Neck ROM: full  Dental      Pulmonary     Cardiovascular     Rhythm: regular    Neuro/Psych    GI/Hepatic/Renal      Endo/Other    Abdominal                   Allergies: Review of patient's allergies indicates no known allergies.    NPO Status: Time of last liquid consumption: 1700                       Time of last solid food consumption: 1700    Anesthesia Plan    ASA 2     epidural     Anesthetic plan and risks discussed with patient.          Milana Huntsman  05/09/2011

## 2011-05-09 NOTE — Other (Signed)
While tidying up room and putting pt's passport back in her purse, purse fell to floor (off the hanger in the closet) and fetal monitor strip was seen in purse.  I stated to pt that I think I found the missing monitor strip and she asked to see her purse.  She asked where I saw it in her purse and I pointed to the area.  She said "How did that get in there!" and that she did not put it in her purse.  Stated that the nurses she had earlier in her stay must have put the monitor strip in her purse by accident.

## 2011-05-09 NOTE — Other (Signed)
Dr V Stallkamp called for delivery.

## 2011-05-09 NOTE — Other (Signed)
Assumed care of pt.  S/O asleep in room.

## 2011-05-09 NOTE — Other (Signed)
Dr Hetty Ely called for epidural.  States he is coming from home.

## 2011-05-09 NOTE — Other (Signed)
Delivered viable baby boy.  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  Weight 6-11 (3055 gm)  Length 20.5 cm)  No epis or repair.

## 2011-05-09 NOTE — Other (Signed)
Dr Alfonse Ras called for epidural.  Informed to call Dr Hetty Ely for epidural.

## 2011-05-09 NOTE — Other (Signed)
Pt states she would like to have an epidural.  Pain is now 8

## 2011-05-09 NOTE — Other (Signed)
Foley catheter (#16) inserted.  Draining yellow urine.

## 2011-05-09 NOTE — Procedures (Signed)
[facility]  CONTINUOUS LABOR EPIDURAL PROCEDURE    Name:  Jenny Ramirez                                         Age: 25 y.o.  MRN:  [id number]    Attending Obstetrician:  Lynnell Catalan, MD  Procedure: Labor Epidural [id number]     Diagnosis: Vaginal Delivery [650N]    Procedure Start Time:  8.45  Procedure End Time:  9.10    Allergies:  Review of patient's allergies indicates no known allergies.    H&P Status: H&P was reviewed, the patient was examined and no change has occurred in patient's condition since H&P was completed.    Diagnostic Data Review:  PT/INR    No components found with this basename: PTPATIENT, PTINR     PT/INR Warfarin:    No components found with this basename: PTPATWAR, PTINRWAR     PTT:   No results found for this basename: APTT     PTT Heparin:   No components found with this basename: APTTHEP     CBC:   Lab Results   Component Value Date    WBC 13.7 05/08/2011    WBC 12.0 10/09/2010    RBC 3.81 05/08/2011    RBC 4.42 10/09/2010    HGB 12.5 05/08/2011    HCT 36.0 05/08/2011    MCV 94.4 05/08/2011    RDW 13.5 05/08/2011    PLT 210 05/08/2011       Consent:  Risks and benefits of the labor epidural were discussed and the patient was given the opportunity to ask questions. Informed consent was obtained.    Procedure:  Position: Sitting.  Sterile technique: Hat,mask,gloves.    Skin Prep/Drape: Betadine.  Skin Local: 3ml 1% lidocaine  Interspace: L3-L4   Catheter Distances:  Skin to epidural space 8cm.  Catheter 14cm at skin.  Aspiration for CSF/Blood: Negative    Paresthesia: Negative    Test dose: Negative (3ml 1.5%Lidocaine with 1:200,000 Epinephrine)    Difficulties/Complications: None    Epidural Medication:  Bolus Dose:   Premixed Syringe: Ropivacaine 0.2% 12ml given. Injection was made incrementally with constant monitoring and aspiration.  Infusion Dose:  Premixed Bag: Ropivacaine 0.1% with Fentanyl 3 mcg/ml started at 12 ml/hr.    Milana Huntsman, MD (ATTENDING ANESTHESIOLOGIST: )    9:11 AM

## 2011-05-09 NOTE — Other (Signed)
Epidural discontinued.  42.77 ml infused and rest discarded.  Witnessed by Jola Baptist RN

## 2011-05-09 NOTE — Other (Signed)
Eating regular diet.

## 2011-05-10 LAB — HEMOGLOBIN: Hemoglobin: 12.3 gm/dl (ref 12.0–16.0)

## 2011-05-10 MED ADMIN — docusate sodium (COLACE) capsule 100 mg: ORAL | NDC 57896040101

## 2011-05-10 MED ADMIN — ibuprofen (ADVIL;MOTRIN) tablet 800 mg: 800 mg | ORAL | @ 17:00:00 | NDC 62584074811

## 2011-05-10 MED ADMIN — oxyCODONE-acetaminophen (PERCOCET) 5-325 MG per tablet 2 tablet: 2 | ORAL | @ 12:00:00 | NDC 00406051262

## 2011-05-10 MED ADMIN — oxyCODONE-acetaminophen (PERCOCET) 5-325 MG per tablet 2 tablet: 2 | ORAL | @ 03:00:00 | NDC 00406051262

## 2011-05-10 MED ADMIN — oxyCODONE-acetaminophen (PERCOCET) 5-325 MG per tablet 2 tablet: 2 | ORAL | @ 21:00:00 | NDC 00406051262

## 2011-05-10 MED FILL — ONDANSETRON HCL 4 MG PO TABS: 4 MG | ORAL | Qty: 2

## 2011-05-10 MED FILL — FERROUS SULFATE 325 (65 FE) MG PO TABS: 325 (65 Fe) MG | ORAL | Qty: 1

## 2011-05-10 MED FILL — OXYCODONE-ACETAMINOPHEN 5-325 MG PO TABS: 5-325 MG | ORAL | Qty: 2

## 2011-05-10 MED FILL — DOCUSATE SODIUM 100 MG PO CAPS: 100 MG | ORAL | Qty: 1

## 2011-05-10 MED FILL — OXYCODONE-ACETAMINOPHEN 5-325 MG PO TABS: 5-325 mg | ORAL | Qty: 2

## 2011-05-10 MED FILL — IBUPROFEN 800 MG PO TABS: 800 MG | ORAL | Qty: 1

## 2011-05-10 NOTE — Progress Notes (Signed)
Department of Obstetrics and Gynecology  Labor and Delivery  Attending Post Partum Progress Note    PPD #1    SUBJECTIVE: Feeling well    OBJECTIVE:     Vitals:  BP 114/57  Pulse 79  Temp(Src) 96.8 F (36 C) (Oral)  Resp 18  Ht 5\' 7"  (1.702 m)  Wt 212 lb (96.163 kg)  BMI 33.20 kg/m2  SpO2 96%  Breastfeeding? No    Uterus:  normal size, well involuted, firm, non-tender    DATA:    Hemoglobin:   Lab Results   Component Value Date    HGB 12.3 05/10/2011       ASSESSMENT & PLAN:  Doing well. Plan discharge on day 2.    Sammy Cassar L. Vikrant Pryce

## 2011-05-10 NOTE — Plan of Care (Signed)
Problem: VAGINAL DELIVERY - RECOVERY AND POST PARTUM  Goal: Demonstrates appropriate breast feeding techniques  Outcome: Not Met This Shift  Patient is bottle feeding

## 2011-05-10 NOTE — Anesthesia Post-Procedure Evaluation (Signed)
[facility]  POST-ANESTHESIA NOTE       Name:  Jenny Ramirez                                         Age:  25 y.o.  MRN:  [id number]      Last Vitals:  BP 114/57  Pulse 79  Temp(Src) 96.8 F (36 C) (Oral)  Resp 18  Ht 5\' 7"  (1.702 m)  Wt 212 lb (96.163 kg)  BMI 33.20 kg/m2  SpO2 96%  Breastfeeding? No  Patient Vitals for the past 4 hrs:   BP Temp Temp src Pulse Resp   05/10/11 0823 114/57 mmHg 96.8 F (36 C) Oral 79  18        Level of Consciousness:  Awake    Respiratory:  Stable    Oxygen Saturation:  Stable    Cardiovascular:  Stable    Hydration:  Adequate    PONV:  Stable    Post-op Pain:  Adequate analgesia    Post-op Assessment:  No apparent anesthetic complications    Additional Follow-Up / Treatment / Comment:  None    Milana Huntsman, MD  May 10, 2011   11:30 AM

## 2011-05-11 MED ADMIN — Tdap (ADACEL) injection 0.5 mL: INTRAMUSCULAR | @ 15:00:00 | NDC 49281040010

## 2011-05-11 MED ADMIN — docusate sodium (COLACE) capsule 100 mg: ORAL | @ 02:00:00 | NDC 57896040101

## 2011-05-11 MED ADMIN — ibuprofen (ADVIL;MOTRIN) tablet 800 mg: 800 mg | ORAL | @ 13:00:00 | NDC 62584074811

## 2011-05-11 MED ADMIN — ibuprofen (ADVIL;MOTRIN) tablet 800 mg: 800 mg | ORAL | @ 02:00:00 | NDC 62584074811

## 2011-05-11 MED ADMIN — oxyCODONE-acetaminophen (PERCOCET) 5-325 MG per tablet 2 tablet: 2 | ORAL | @ 10:00:00 | NDC 00406051262

## 2011-05-11 MED ADMIN — oxyCODONE-acetaminophen (PERCOCET) 5-325 MG per tablet 2 tablet: 2 | ORAL | @ 02:00:00 | NDC 00406051262

## 2011-05-11 MED FILL — IBUPROFEN 800 MG PO TABS: 800 MG | ORAL | Qty: 1

## 2011-05-11 MED FILL — OXYCODONE-ACETAMINOPHEN 5-325 MG PO TABS: 5-325 MG | ORAL | Qty: 2

## 2011-05-11 MED FILL — DOCUSATE SODIUM 100 MG PO CAPS: 100 MG | ORAL | Qty: 1

## 2011-05-11 MED FILL — OXYCODONE-ACETAMINOPHEN 5-325 MG PO TABS: 5-325 mg | ORAL | Qty: 2

## 2011-05-11 NOTE — Other (Signed)
Discharge instructions completed.  Patient verbalized understanding.  Patient discharged per wheelchair to waiting vehicle.

## 2011-05-11 NOTE — Discharge Summary (Signed)
Obstetric Discharge Summary      Pt Name: Jenny Ramirez  MRN: [id number] Acct #: [id number]  Date of Birth: November 28, 1985        Admitting Diagnosis  IUP  OB History     Grav Para Term Preterm Abortions TAB SAB Ect Mult Living    2 1 1       1           Reasons for Admission on 05/09/2011 12:36 PM  OBSTETRICS                                               [id number]  No comment available  Vaginal Delivery      Intrapartum Procedures        Multiple birth?: no                 Postpartum/Operative Complications       Newborn Data  Information for the patient's newborn:  Clarence, [place] [[id number]]   female  Birth Weight: 6 lbs 11.76 oz (3.055 kg)      Discharge Diagnosis       Discharge Information  Current Discharge Medication List      CONTINUE these medications which have NOT CHANGED    Details   prenatal vitamin (PRENATAL-S) 27-0.8 MG TABS Take 1 tablet by mouth daily.               No discharge procedures on file.    Vaginal Delivery  Diet regular    Discharge to:  home  Follow up in 5-6 wks    Devony Mcgrady L. Teonna Coonan,M.D.05/11/2011,10:13 AM

## 2011-05-11 NOTE — Discharge Instructions (Signed)
DISCHARGE INSTRUCTIONS FOR  PATIENTS AND FAMILY  [facility]  [phone]  [address]  [address]  [place], [place] [id number]      Discharge Instructions for Labor and Delivery, Vaginal Birth     A vaginal birth refers to the baby being delivered through the vagina. The amount of time that labor can take varies greatly. Labor for the average first-born baby is about 16 hours.   Usually your hospital stay after a routine delivery is no more than two nights. Some new mothers go home the same day. Recovery from childbirth varies depending upon whether you had an episiotomy (an incision in the perineum, the area between your vaginal opening and your anus), the duration of labor and delivery, and the amount of rest you get.   In general, it takes about 6-8 weeks for a woman's body to recover from childbirth.   What You Will Need   Along with your medications, you will need the following:    Sanitary pads     Nursing pads     Witch hazel pads     Possibly a Sitz bath        Home Care    You will want to arrange for transportation home for you and your baby. The baby will need a car seat. You will receive instructions in the hospital for breastfeeding and taking care of the perineum area. You may use ointment for cracked nipples or warm water rinses to your perineum.    You will need to wear sanitary pads for about six weeks after delivery.     If you had an episiotomy or vaginal tear, you will be sent home with a plastic squirt bottle. Fill it with warm water and squirt over the vaginal and anal area every time you urinate and defecate.     Warm baths can be soothing to healing tissues.     Apply warm or cold cloths to sore breasts.     Apply ointment to cracked nipples.     Use nursing pads for leaky breast.     Apply witch hazel pads to sore perineum (area between vagina and anus).     Ask your doctor about when it is safe for you to shower or bathe.     Sit in a sitz bath to soothe sore perineum  and/or hemorrhoids. A sitz bath is soaking the hip and buttocks area in warm water. You can buy a plastic sitz bath at most drugstores. It will fit on your toilet. You can also use your bathtub.                  Diet    Eat a well balanced, healthy diet to help your recover from childbirth. If you are breastfeeding, you will need additional calories each day. You may also be advised to avoid certain foods.   Some women experience constipation after childbirth. To avoid this problem:    Drink plenty of fluids.    Eat foods high in fiber, such as:    Whole grain cereals and breads    Fruits and vegetables    Legumes (eg, beans, lentils)       Physical Activity    Labor and delivery is tiring. Rest when you can to regain your energy. Your doctor will encourage you to exercise by walking. Ask your doctor when you will be able to return to more strenuous exercise.   Your doctor may suggest you  do Kegel exercises to strengthen your pelvic muscles. To do these tighten your muscles as if you were stopping your urine flow. Hold for a few seconds and then relax. Do these throughout the day.       Medications    Breastfeeding can cause your uterus to contract. If painful, your doctor may recommend a pain reliever. If you are breastfeeding, it's important to ask your doctor about taking medications. You may receive from the hospital a list of medications to avoid.   Once your doctor has approved your medications, it's important to:    Take your medication as directed. Do not change the amount or the schedule.    Do not stop taking them without talking to your doctor.    Do not share them.    Know what the results and side effects may be. Report bothersome side effects to your doctor.    Some drugs can be dangerous when mixed. Talk to a doctor or pharmacist if you are taking more than one drug. This includes over-the-counter medication and herb or dietary supplements.    Plan ahead for refills so you don't run out.     Ibuprofen over the counter works well for uterine contractions                                Lifestyle Changes    Having a baby requires significant lifestyle changes. You and your doctor will plan lifestyle changes that will help you recover. Some things to keep in mind include:    It is important to get enough rest so you can recover. Try sleeping when the baby sleeps.    Ask your doctor when you can resume sexual relations. If you have not done so already, talk to your doctor about birth control options.    If you are breastfeeding, consider a breast pump.    Call your obstetrician and/or pediatrician for any questions that arise.    Understand the changes your body is going through as it recovers from childbirth:    Hot and cold flashes as your body adjusts to new hormone and blood flow levels    Urinary or fecal incontinence due to stretched muscles    After pains from uterine contractions as the uterus shrinks    Vaginal bleeding (called lochia), which is heavier than your period (generally stops within two months)    Baby blues, feelings of irritability, sadness, crying, or anxiety. Postpartum depression is more severe, occurring in 10%-20% of new moms. If you experience such symptoms, contact your doctor.    Consider joining a support group for new mothers. You can get encouragement and learn parenting strategies.       Follow-up   Schedule a follow-up appointment as directed by your doctor.   Call Your Doctor If Any of the Following Occurs   It is important for you to monitor your recovery once you leave the hospital. That way, you can alert your doctor to any problems immediately. If any of the following occurs, call your doctor:    Signs of infection, including fever and chills     Increased bleeding: soaking more than one sanitary pad an hour     Wounds that become red, swollen or drain pus     Vaginal discharge that smells foul     New pain, swelling, or tenderness in your legs      Pain that you  can't control with the medications you've been given     Pain, burning, urgency or frequency of urination, or persistent bleeding in the urine     Cough, shortness of breath, or chest pain     Depression, suicidal thoughts, or feelings of harming your baby     Breasts that are hot, red and accompanied by fever     Any cracking or bleeding from the nipple or areola (the dark-colored area of the breast)      In case of an emergency, call 911 immediately.     Jenny Ramirez,M.D..8/6/201210:13 AM DISCHARGE INSTRUCTIONS FOR  PATIENTS AND FAMILY  [facility]  [phone]  [address]  [address]  [place], [place] [address]      Discharge Instructions for Labor and Delivery, Vaginal Birth     A vaginal birth refers to the baby being delivered through the vagina. The amount of time that labor can take varies greatly. Labor for the average first-born baby is about 16 hours.   Usually your hospital stay after a routine delivery is no more than two nights. Some new mothers go home the same day. Recovery from childbirth varies depending upon whether you had an episiotomy (an incision in the perineum, the area between your vaginal opening and your anus), the duration of labor and delivery, and the amount of rest you get.   In general, it takes about 6-8 weeks for a woman's body to recover from childbirth.   What You Will Need   Along with your medications, you will need the following:    Sanitary pads     Nursing pads     Witch hazel pads     Possibly a Sitz bath        Home Care    You will want to arrange for transportation home for you and your baby. The baby will need a car seat. You will receive instructions in the hospital for breastfeeding and taking care of the perineum area. You may use ointment for cracked nipples or warm water rinses to your perineum.    You will need to wear sanitary pads for about six weeks after delivery.     If you had an episiotomy or vaginal tear, you will be  sent home with a plastic squirt bottle. Fill it with warm water and squirt over the vaginal and anal area every time you urinate and defecate.     Warm baths can be soothing to healing tissues.     Apply warm or cold cloths to sore breasts.     Apply ointment to cracked nipples.     Use nursing pads for leaky breast.     Apply witch hazel pads to sore perineum (area between vagina and anus).     Ask your doctor about when it is safe for you to shower or bathe.     Sit in a sitz bath to soothe sore perineum and/or hemorrhoids. A sitz bath is soaking the hip and buttocks area in warm water. You can buy a plastic sitz bath at most drugstores. It will fit on your toilet. You can also use your bathtub.                  Diet    Eat a well balanced, healthy diet to help your recover from childbirth. If you are breastfeeding, you will need additional calories each day. You may also be advised to avoid certain foods.  Some women experience constipation after childbirth. To avoid this problem:    Drink plenty of fluids.    Eat foods high in fiber, such as:    Whole grain cereals and breads    Fruits and vegetables    Legumes (eg, beans, lentils)       Physical Activity    Labor and delivery is tiring. Rest when you can to regain your energy. Your doctor will encourage you to exercise by walking. Ask your doctor when you will be able to return to more strenuous exercise.   Your doctor may suggest you do Kegel exercises to strengthen your pelvic muscles. To do these tighten your muscles as if you were stopping your urine flow. Hold for a few seconds and then relax. Do these throughout the day.       Medications    Breastfeeding can cause your uterus to contract. If painful, your doctor may recommend a pain reliever. If you are breastfeeding, it's important to ask your doctor about taking medications. You may receive from the hospital a list of medications to avoid.   Once your doctor has approved your medications,  it's important to:    Take your medication as directed. Do not change the amount or the schedule.    Do not stop taking them without talking to your doctor.    Do not share them.    Know what the results and side effects may be. Report bothersome side effects to your doctor.    Some drugs can be dangerous when mixed. Talk to a doctor or pharmacist if you are taking more than one drug. This includes over-the-counter medication and herb or dietary supplements.    Plan ahead for refills so you don't run out.    Ibuprofen over the counter works well for uterine contractions                                Lifestyle Changes    Having a baby requires significant lifestyle changes. You and your doctor will plan lifestyle changes that will help you recover. Some things to keep in mind include:    It is important to get enough rest so you can recover. Try sleeping when the baby sleeps.    Ask your doctor when you can resume sexual relations. If you have not done so already, talk to your doctor about birth control options.    If you are breastfeeding, consider a breast pump.    Call your obstetrician and/or pediatrician for any questions that arise.    Understand the changes your body is going through as it recovers from childbirth:    Hot and cold flashes as your body adjusts to new hormone and blood flow levels    Urinary or fecal incontinence due to stretched muscles    After pains from uterine contractions as the uterus shrinks    Vaginal bleeding (called lochia), which is heavier than your period (generally stops within two months)    Baby blues, feelings of irritability, sadness, crying, or anxiety. Postpartum depression is more severe, occurring in 10%-20% of new moms. If you experience such symptoms, contact your doctor.    Consider joining a support group for new mothers. You can get encouragement and learn parenting strategies.       Follow-up   Schedule a follow-up appointment as directed by your  doctor.   Call Your Doctor If Any of the  Following Occurs   It is important for you to monitor your recovery once you leave the hospital. That way, you can alert your doctor to any problems immediately. If any of the following occurs, call your doctor:    Signs of infection, including fever and chills     Increased bleeding: soaking more than one sanitary pad an hour     Wounds that become red, swollen or drain pus     Vaginal discharge that smells foul     New pain, swelling, or tenderness in your legs     Pain that you can't control with the medications you've been given     Pain, burning, urgency or frequency of urination, or persistent bleeding in the urine     Cough, shortness of breath, or chest pain     Depression, suicidal thoughts, or feelings of harming your baby     Breasts that are hot, red and accompanied by fever     Any cracking or bleeding from the nipple or areola (the dark-colored area of the breast)      In case of an emergency, call 911 immediately.     Jenny Ramirez,M.D..8/6/201210:13 AM

## 2011-05-11 NOTE — Progress Notes (Signed)
Department of Obstetrics and Gynecology  Labor and Delivery  Post Partum Day #2      SUBJECTIVE:  Patient feeling well with no complaints    OBJECTIVE:BP 115/74  Pulse 85  Temp(Src) 96.7 F (35.9 C) (Oral)  Resp 16  Ht 5\' 7"  (1.702 m)  Wt 212 lb (96.163 kg)  BMI 33.20 kg/m2  SpO2 96%  Breastfeeding? No   ABDOMEN:  Soft, non-tender, fundus firm.  GENITAL/URINARY:  Normal post partum findings.    DATA:    Lab Results   Component Value Date    HGB 12.3 05/10/2011    HCT 36.0 05/08/2011       ASSESSMENT & PLAN:    Normal post partum day2  exam.      Jenny Ramirez,M.D.05/11/2011,10:11 AM

## 2011-06-05 ENCOUNTER — Inpatient Hospital Stay: Admit: 2011-06-05 | Discharge: 2011-06-05 | Disposition: A | Discharge status: Home / Self Care

## 2011-06-05 MED ORDER — IBUPROFEN 800 MG PO TABS
800 MG | ORAL_TABLET | Freq: Three times a day (TID) | ORAL | Status: DC | PRN
Start: 2011-06-05 — End: 2014-03-30

## 2011-06-05 MED ORDER — AMOXICILLIN 875 MG PO TABS
875 MG | ORAL_TABLET | Freq: Two times a day (BID) | ORAL | Status: AC
Start: 2011-06-05 — End: 2011-06-15

## 2011-06-05 NOTE — ED Provider Notes (Signed)
Patient is a 25 y.o. female presenting with sinus complaint and cough. The history is provided by the patient.   Sinus Problem  This is a new problem. The current episode started more than 2 days ago. The problem occurs constantly. The problem has been gradually worsening. Pertinent negatives include no chest pain, no abdominal pain, no headaches and no shortness of breath. The symptoms are aggravated by nothing. The symptoms are relieved by nothing. She has tried nothing for the symptoms.   Cough  This is a new problem. The current episode started more than 2 days ago. The problem occurs every few hours. The problem has not changed since onset.The cough is non-productive. There has been no fever. Associated symptoms include ear congestion and sore throat. Pertinent negatives include no chest pain, no chills, no sweats, no ear pain, no headaches, no rhinorrhea, no myalgias, no shortness of breath and no wheezing. She has tried nothing for the symptoms. She is a smoker. Her past medical history does not include bronchitis, pneumonia or asthma.    c/o sore throat and sinus pain/pressure x4 days.    Review of Systems   Constitutional: Negative for fever, chills, appetite change and fatigue.   HENT: Positive for congestion, sore throat, postnasal drip and sinus pressure. Negative for hearing loss, ear pain, rhinorrhea, sneezing, trouble swallowing, neck pain, neck stiffness and tinnitus.    Respiratory: Positive for cough. Negative for chest tightness, shortness of breath and wheezing.    Cardiovascular: Negative for chest pain and palpitations.   Gastrointestinal: Negative for nausea, vomiting, abdominal pain and diarrhea.   Musculoskeletal: Negative for myalgias.   Skin: Negative for rash.   Neurological: Negative for dizziness, weakness, light-headedness and headaches.   Hematological: Negative for adenopathy.   All other systems reviewed and are negative.        Physical Exam   Nursing note and vitals  reviewed.  Constitutional: Vital signs are normal. She appears well-developed and well-nourished. No distress.   HENT:   Head: Normocephalic and atraumatic.   Right Ear: Hearing, external ear and ear canal normal. Tympanic membrane is not perforated, not erythematous, not retracted and not bulging. A middle ear effusion is present.   Left Ear: Hearing, external ear and ear canal normal. Tympanic membrane is not perforated, not erythematous, not retracted and not bulging. A middle ear effusion is present.   Nose: Nose normal.   Mouth/Throat: Uvula is midline and mucous membranes are normal. Oropharyngeal exudate present.       Eyes: Right eye exhibits no discharge. Left eye exhibits no discharge.   Neck: Normal range of motion. Neck supple. No tracheal deviation present. No thyromegaly present.   Cardiovascular: Normal rate, regular rhythm, normal heart sounds and intact distal pulses.  Exam reveals no gallop and no friction rub.    No murmur heard.  Pulmonary/Chest: Effort normal and breath sounds normal. No respiratory distress. She has no wheezes. She exhibits no tenderness.   Lymphadenopathy:     She has no cervical adenopathy.       Procedures    MDM    Labs      Radiology      EKG Interpretation.        Estill Dooms, NP  06/05/11 1100

## 2011-06-05 NOTE — ED Notes (Signed)
lpn findings and assessment reviewed     Jarold Motto, LPN  [id number]

## 2011-06-05 NOTE — Discharge Instructions (Signed)

## 2011-06-30 NOTE — ED Notes (Unsigned)
[facility]    PATIENT NAME:   Jenny Ramirez, Jenny Ramirez            MR #:  [id number]  DATE OF BIRTH:  11/28/1985                        ACCOUNT #:  [id number]  ED PHYSICIAN:   Cliffton Asters, M.D.               ROOM #:  PRIMARY:        No Pcp No Pcp                     NURSING UNIT:  ED  REFERRING:      Selected Referral Pt              FC:  S  DICTATED BY:    Reba L. Delton See, M.D.           ADMIT DATE:  06/30/2011  VISIT DATE:     06/30/2011                        DISCHARGE DATE:                              EMERGENCY DEPARTMENT NOTE    *-*-*    R4 RESIDENT:  Dr. Gust Rung.    MODE OF ARRIVAL:  Walk-in.    CHIEF COMPLAINT:  Shortness of breath.    HISTORY OF PRESENT ILLNESS:  The patient is a 25 year old African American  female with a past medical history significant for asthma who presents  complaining of 4 days of nasal congestion, cough productive of yellow-green  sputum, sneezing and worsening shortness of breath.  The patient states that  she has had progressively worsening shortness of breath over this time period  without any significant relieving factors.  She does have a history of  asthma, however, has no albuterol or other medications at home.  She has not  had an exacerbation since five years ago.  She has no history of intubations  or ICU admissions for this.  The patient also had subjective fevers.  She  denies any nausea, vomiting, chest pain, abdominal pain, lower extremity  swelling or sick contacts.  She has no recent travel, shortness of breath,  has no significant aggravating or relieving factors.  It is not positional.  It was intermittently worse with exertion.    REVIEW OF SYSTEMS:  Please refer to HPI for additional pertinent positives  and negatives.  All other systems reviewed and negative unless otherwise  stated.    PAST MEDICAL HISTORY:    1.  Asthma.    MEDICATIONS:  None.    ALLERGIES:  No known drug allergies.    FAMILY HISTORY:  Mother,  two brothers and one sister all with asthma.  No  other family history of pulmonary disease.    SOCIAL HISTORY:  The patient smokes 10 cigarettes per day, drinks one to two  beers per week and smokes one joint of marijuana per day.  Works as a Production assistant, radio  at [employer].    PHYSICAL EXAMINATION:    VITAL SIGNS:  Blood pressure 124/79, pulse 100 at triage, on recheck 88,  respiratory rate 18, temperature 99.1, oxygen saturation 93% on room air, on  recheck 98% on room air.  GENERAL:  A well-nourished, thin  African American female in no acute  distress, sitting comfortably on the stretcher, able to converse in full  sentences without difficulty.  HEENT:  Normocephalic, atraumatic.  Pupils equal, round and reactive to  light.  Extraocular movements intact.  Oropharynx is without lesions or  erythema.  Mucous membranes moist.  NECK:  Supple.  Trachea midline.  CARDIOVASCULAR:  Regular rate and rhythm.  Clear S1, S2.  No murmurs, rubs or  gallops.  PULMONARY:  Intermittent expiratory wheezes appreciated bilaterally.  No  crackles, good air movement throughout.  No accessory muscle use.  No  tripoding, no rhonchi.  ABDOMEN:  Normoactive bowel sounds, soft, nontender, nondistended.  EXTREMITIES:  2+ radial pulses.  No lower extremity edema.  NEUROLOGIC:  Alert and oriented x 3, able to move all extremities.  PSYCHIATRIC:  Appropriate mood and affect.    TEST RESULTS:  Chest x-ray:  No acute cardiopulmonary disease.    EMERGENCY DEPARTMENT COURSE:  The patient is a 25 year old African American  female with a past medical history of asthma who presents with a chief  complaint of nasal congestion, cough and shortness of breath for four days.  She was admitted to B pod where she was evaluated by myself, the attending ED  physician and the R4 ED resident.  A full history and physical exam were  performed and appropriate imaging was obtained.  On presentation, primary  concern was for pneumonia.  The patient's chest x-ray was  negative, however,  consequently, this is unlikely.  Given the patient's cough, nasal congestion,  rhinorrhea, this is likely an upper respiratory infection, probably viral.  She does have a history of asthma.  Wheezes were heard on examination.  Consequently, this infection is likely to have triggered a mild asthma  exacerbation.  The patient received DuoNeb x1 and albuterol treatments x2 as  well as two puffs of albuterol MDI with teaching and went home with the MDI  and metered dose inhaler.  She felt like her breathing was back to baseline  following even just one of the breathing treatments and felt significantly  improved overall.  The patient was also given prednisone 60 mg by mouth.  The  patient has no history of heart problems.  Consequently, a cardiac cause for  her shortness of breath is highly unlikely.  The patient has upper  respiratory symptoms typical for upper respiratory infection making this  diagnosis much more likely.  Viral myocarditis is also unlikely in this  patient as she has an unremarkable cardiac exam, no evidence of pulmonary  edema and no chest pain and is afebrile here today.  Consequently, no further  workup was felt necessary at this time.  The patient requested discharge home  at this time as her symptoms have significantly improved.  We felt  comfortable with this.    CLINICAL IMPRESSION:    1.  Upper respiratory infection.  2.  Asthma exacerbation.    DISPOSITION:  The patient was discharged home in good condition.    PLAN:    1.  The patient went home with a metered dose inhaler.  She was also given a  prescription for a second albuterol MDI inhaler should her first one run out.  2.  She was discharged home with a prescription for prednisone 60 mg by mouth  daily for four days to complete a five-day course.  3.  She should follow instructions provided on discharge paperwork and return  to the emergency department if she develops  worsening shortness of breath,  chest pain,  fainting, lightheadedness, fevers or has any other significant  concerns.    Qfem    *-*-*                                            _______________________________________  JLN/dd                                 _____  D:  07/01/2011 04:56                  Hayleigh L. Delton See, M.D.  T:  07/01/2011 19:42  Job #:  [id number]                                        _______________________________________                                         _____                                        Cliffton Asters, M.D.                                 EMERGENCY DEPARTMENT NOTE                                                               PAGE    1 of   1

## 2012-06-10 ENCOUNTER — Encounter: Payer: Self-pay | Admitting: Family Medicine

## 2012-06-10 ENCOUNTER — Ambulatory Visit (INDEPENDENT_AMBULATORY_CARE_PROVIDER_SITE_OTHER): Payer: Self-pay | Admitting: Family Medicine

## 2012-06-10 VITALS — BP 103/64 | HR 73 | Ht 72.0 in | Wt 161.8 lb

## 2012-06-10 DIAGNOSIS — Z0289 Encounter for other administrative examinations: Secondary | ICD-10-CM

## 2012-06-10 DIAGNOSIS — Z025 Encounter for examination for participation in sport: Secondary | ICD-10-CM

## 2012-06-13 ENCOUNTER — Encounter: Payer: Self-pay | Admitting: Family Medicine

## 2012-06-13 DIAGNOSIS — Z025 Encounter for examination for participation in sport: Secondary | ICD-10-CM | POA: Insufficient documentation

## 2012-06-13 NOTE — Assessment & Plan Note (Signed)
Cleared for all sports without restrictions. 

## 2012-06-13 NOTE — Progress Notes (Signed)
Patient ID: Connie Glenn, female   DOB: 1986/07/26, 26 y.o.   MRN: [id number]  Patient is a 26 y.o. year old female here for sports physical.  Patient plans to play basketball.  Reports no current complaints.  Denies chest pain, shortness of breath, passing out with exercise.  No medical problems.  No family history of heart disease or sudden death before age 63.   Vision 20/20 each eye without correction Blood pressure normal for age and height  History reviewed. No pertinent past medical history.  No current outpatient prescriptions on file prior to visit.    History reviewed. No pertinent past surgical history.  No Known Allergies  History   Social History  . Marital Status: Single    Spouse Name: N/A    Number of Children: N/A  . Years of Education: N/A   Occupational History  . Not on file.   Social History Main Topics  . Smoking status: Never Smoker   . Smokeless tobacco: Not on file  . Alcohol Use: Not on file  . Drug Use: Not on file  . Sexually Active: Not on file   Other Topics Concern  . Not on file   Social History Narrative  . No narrative on file    Family History  Problem Relation Age of Onset  . Sudden death Neg Hx   . Heart attack Neg Hx     BP 103/64  Pulse 73  Ht 6' (1.829 m)  Wt 161 lb 12.8 oz (73.392 kg)  BMI 21.94 kg/m2  Review of Systems: See HPI above.  Physical Exam: Gen: NAD CV: RRR no MRG Lungs: CTAB MSK: FROM and strength all joints and muscle groups.  No evidence scoliosis.  Assessment/Plan: 1. Sports physical: Cleared for all sports without restrictions.

## 2013-02-25 ENCOUNTER — Inpatient Hospital Stay
Admit: 2013-02-25 | Discharge: 2013-02-26 | Disposition: A | Discharge status: Home / Self Care | Attending: Family Medicine

## 2013-02-25 LAB — GLOMERULAR FILTRATION RATE, ESTIMATED: Est, Glom Filt Rate: 87 mL/min/{1.73_m2} — AB

## 2013-02-25 LAB — CBC WITH AUTO DIFFERENTIAL
Basophils Absolute: 0 10*3/uL (ref 0.0–0.1)
Basophils: 0.4 %
Eosinophils Absolute: 0 10*3/uL (ref 0.0–0.4)
Eosinophils: 0.4 %
Hematocrit: 41.7 % (ref 37.0–47.0)
Hemoglobin: 14.4 gm/dl (ref 12.0–16.0)
Lymphocytes Absolute: 1.9 10*3/uL (ref 1.0–4.8)
Lymphocytes: 15.8 %
MCH: 32.2 pg — ABNORMAL HIGH (ref 27.0–31.0)
MCHC: 34.5 gm/dl (ref 33.0–37.0)
MCV: 93.1 fL (ref 81.0–99.0)
MPV: 9.7 mcm (ref 7.4–10.4)
Monocytes Absolute: 1.2 10*3/uL (ref 0.4–1.3)
Monocytes: 10 %
Platelets: 265 10*3/uL (ref 130–400)
RBC Morphology: NORMAL
RBC: 4.48 10*6/uL (ref 4.20–5.40)
RDW: 13.8 % (ref 11.5–14.5)
Seg Neutrophils: 73.4 %
Segs Absolute: 8.7 10*3/uL — ABNORMAL HIGH (ref 1.8–7.7)
WBC: 11.9 10*3/uL — ABNORMAL HIGH (ref 4.8–10.8)
nRBC: 0 /100 wbc

## 2013-02-25 LAB — HEPATIC FUNCTION PANEL
ALT: 32 U/L (ref 11–66)
AST: 22 U/L (ref 5–40)
Albumin: 3.8 gm/dl (ref 3.5–5.1)
Alkaline Phosphatase: 125 U/L (ref 38–126)
Bilirubin, Direct: 0.2 mg/dl (ref 0.0–0.3)
Total Bilirubin: 0.5 mg/dl (ref 0.3–1.2)
Total Protein: 8.2 gm/dl — ABNORMAL HIGH (ref 6.1–8.0)

## 2013-02-25 LAB — BASIC METABOLIC PANEL
BUN: 8 mg/dl (ref 7–22)
CO2: 24 meq/l (ref 23–33)
Calcium: 9.9 mg/dl (ref 8.5–10.5)
Chloride: 101 meq/l (ref 98–111)
Creatinine: 0.8 mg/dl (ref 0.4–1.2)
Glucose: 98 mg/dl (ref 70–108)
Potassium: 3.7 meq/l (ref 3.5–5.2)
Sodium: 137 meq/l (ref 135–145)

## 2013-02-25 LAB — HCG, SERUM, QUALITATIVE: Preg, Serum: NEGATIVE

## 2013-02-25 LAB — ANION GAP: Anion Gap: 15.7 (ref 10.0–20.0)

## 2013-02-25 LAB — LIPASE: Lipase: 21.2 U/L (ref 5.6–51.3)

## 2013-02-25 LAB — OSMOLALITY: Osmolality Calc: 272.1 mOsmol/kg — ABNORMAL LOW (ref 275.0–300)

## 2013-02-25 NOTE — ED Notes (Signed)
Reassessment of the patients Abdominal Pain   has significantly worsened, the patients pain reassessment is a 8-9/10, Side rails up times 2, call light in reach, will continue to monitor.      Orest Dikes, RN  02/25/13 [phone]

## 2013-02-25 NOTE — ED Notes (Signed)
Pt respirations easy, unlabored.  Skin p/w/d.  Pt c/o generalized abdominal pain and feeling full and bloated.  Pt states she has not had bowel movement for about 4 days.  Pt states she did give herself an enema and had a very small bowel movement.  Pt states she had an IUD removed this past Thursday due to it coming out of the cervix.  Pt states she did have a small amount of spotting today.  Pt states the abdominal pain has been on and off for a week but worse the last two days.  C/o of nausea but denies vomiting.     Orest Dikes, RN  02/25/13 [phone]

## 2013-02-25 NOTE — ED Provider Notes (Signed)
[facility]  eMERGENCY dEPARTMENT eNCOUnter          CHIEF COMPLAINT       Chief Complaint   Patient presents with   ??? Abdominal Pain       Nurses Notes reviewed and I agree except as noted in the HPI.    HISTORY OF PRESENT ILLNESS    Jenny Ramirez is a 27 y.o. female who presents to the Emergency Department for the evaluation of abdominal pain. Patient states that for the past 2 weeks she has had intermittent abdominal pain, described as generalized, nonradiating, 10/10 intensity. She notes associated bloating and constipation. Her last BM was last night after giving herself an enema. She states that she has been unable to sleep at night due to the pain. She denies any sore throat, otalgia, nausea, or vomiting    Patient does note that 3 days ago she had an IUD removed due to the fact that it had ceased to be correctly situated, and she had noted some mild vaginal discharge    The HPI was provided by the patient.        REVIEW OF SYSTEMS     Review of Systems   Constitutional: Negative for fever, chills and unexpected weight change.   HENT: Negative for ear pain, congestion and sore throat.    Eyes: Negative for visual disturbance.   Respiratory: Negative for cough, shortness of breath and wheezing.    Cardiovascular: Negative for chest pain, palpitations and leg swelling (No calf pain or tenderness).   Gastrointestinal: Positive for abdominal pain and constipation. Negative for nausea, vomiting, diarrhea and blood in stool.        Bloating   Genitourinary: Positive for vaginal discharge. Negative for dysuria and hematuria.   Musculoskeletal: Negative for back pain and arthralgias.   Skin: Negative for rash.   Neurological: Negative for dizziness, syncope, weakness, numbness and headaches.   Psychiatric/Behavioral: Negative for dysphoric mood. The patient is not nervous/anxious.           PAST MEDICAL HISTORY    has a past medical history of Abnormal Pap smear.    SURGICAL HISTORY      has past  surgical history that includes knee surgery (2009); Wisdom tooth extraction (2004); and fracture surgery.    CURRENT MEDICATIONS       Discharge Medication List as of 02/25/2013 10:40 PM      CONTINUE these medications which have NOT CHANGED    Details   acetaminophen (TYLENOL) 325 MG tablet Take 650 mg by mouth every 6 hours as needed.        ibuprofen (IBU) 800 MG tablet Take 1 tablet by mouth every 8 hours as needed for Pain or Fever., Disp-30 tablet, R-0      prenatal vitamin (PRENATAL-S) 27-0.8 MG TABS Take 1 tablet by mouth daily.               ALLERGIES      has no known allergies.    FAMILY HISTORY     indicated that her mother is alive. She indicated that her father is alive.  family history is not on file.    SOCIAL HISTORY      reports that she has been smoking Cigarettes.  She has a .125 pack-year smoking history. She does not have any smokeless tobacco history on file. She reports that she does not drink alcohol or use illicit drugs.    PHYSICAL EXAM  INITIAL VITALS:  weight is 200 lb (90.719 kg). Her oral temperature is 98.3 ??F (36.8 ??C). Her blood pressure is 116/81 and her pulse is 108. Her respiration is 18 and oxygen saturation is 98%.      Physical Exam   Constitutional: She is oriented to person, place, and time. She appears well-developed and well-nourished.   HENT:   Head: Normocephalic and atraumatic.   Right Ear: External ear normal.   Left Ear: External ear normal.   Eyes: Right eye exhibits no discharge. Left eye exhibits no discharge. No scleral icterus.   Neck: Normal range of motion. No JVD present. No tracheal deviation present. No thyromegaly present.   Cardiovascular: Normal rate and regular rhythm.  Exam reveals no gallop and no friction rub.    No murmur heard.  Pulmonary/Chest: Effort normal and breath sounds normal. No stridor. No respiratory distress. She has no wheezes. She has no rales.   Abdominal: Soft. She exhibits no distension. There is generalized tenderness. There is  no rebound and no guarding.   Genitourinary: Uterus normal. Vaginal discharge found.   Examination with the nurse noted external genitalia appear normal. The vaginal vault was normal there was some discharge been present off from the area. This was expected as the LAD was removed. Palpation of the uterus no enlargement but there is discomfort of the anterior area and also both adnexa. There is no adnexal masses appreciated. Cultures were obtained   Musculoskeletal: She exhibits no edema and no tenderness.   Neurological: She is alert and oriented to person, place, and time. Coordination normal.   Skin: Skin is warm and dry. No rash (On exposed body surfaces) noted. She is not diaphoretic.   Psychiatric: She has a normal mood and affect. Her behavior is normal. Thought content normal.         DIFFERENTIAL DIAGNOSIS:   Colitis, Diverticulitis, Pelvic Inflammatory Disease    DIAGNOSTIC RESULTS     RADIOLOGY: non-plain film images(s) such as CT, Ultrasound and MRI are read by the radiologist.  The patient had a US transvaginal performed which demonstrates small amount of pelvic free fluid. Otherwise no acute findings. Progress imaging as clinically determined.    [x]  Visualized and interpreted by me   [x]  Radiologist's Wet Read Report Reviewed   []  Discussed with Radiologist.    LABS:   Labs Reviewed   GENITAL CULTURE - Abnormal; Notable for the following:     Genital Culture, Routine No growth-preliminary (*)     Organism Neisseria gonorrhoeae (*)     All other components within normal limits    Narrative:     Source: non-obstetric female genital source       Site: vaginal          Current   Antibiotics: none   URINE CULTURE, REFLEXED - Abnormal; Notable for the following:     Urine Culture Reflex   (*)     Value: Mixed growth.  The mixture of organisms present represents      both organisms that may cause urinary tract infections and      organisms that are not a common cause of urinary tract      infections and are  possibly distal urethral flora.    Organism Mixed Growth (*)     All other components within normal limits    Narrative:     Source: cath urine       Site: catheter -mini-cath  Current Antibiotics: none   CBC WITH AUTO DIFFERENTIAL - Abnormal; Notable for the following:     WBC 11.9 (*)     MCH 32.2 (*)     Segs Absolute 8.7 (*)     All other components within normal limits   HEPATIC FUNCTION PANEL - Abnormal; Notable for the following:     Total Protein 8.2 (*)     All other components within normal limits   GLOMERULAR FILTRATION RATE, ESTIMATED - Abnormal; Notable for the following:     Est, Glom Filt Rate 87 (*)     All other components within normal limits   OSMOLALITY - Abnormal; Notable for the following:     Osmolality Calc 272.1 (*)     All other components within normal limits   URINALYSIS/URINE CULTURE - Abnormal; Notable for the following:     Bilirubin Urine SMALL (*)     Blood, Urine SMALL (*)     Leukocyte Esterase, Urine MODERATE (*)     Color, UA DK YELLOW (*)     Character, Urine CLOUDY (*)     All other components within normal limits   KOH PREP    Narrative:     Source: hair       Site:           Current Antibiotics: not stated   WET PREP, GENITAL    Narrative:     Source: vaginal       Site:           Current Antibiotics: not stated   CHLAMYDIA CULTURE   BASIC METABOLIC PANEL   LIPASE   HCG, SERUM, QUALITATIVE   ANION GAP   BILE ACIDS, TOTAL       EMERGENCY DEPARTMENT COURSE:   Vitals:    Filed Vitals:    02/25/13 1807 02/25/13 2130   BP: 143/73 116/81   Pulse: 116 108   Temp: 98.3 ??F (36.8 ??C)    TempSrc: Oral    Resp: 18 18   Weight: 200 lb (90.719 kg)    SpO2: 98% 98%     7:00 PM: The patient was seen and evaluated.    MDM:    Patient presents to the ED for evaluation of abdominal pain, with associated bloating and constipation. She noted that prior to the onset of her sx, she had an IUD removed due to incorrect placement and some vaginal discharge. Vitals were WNL, exam was  significant for generalized abdominal tenderness. Labs showed no evidence of infection, and US transvaginal showed a small amount of free fluid, but no acute findings. Given these results, the patient was dx with Pelvic inflammatory disease. She was given   Norco in the ED which improved her pain. She was given a referral to Dr. Anda Latina, MD for follow up and rx doxycycline, Norco, phenergan, and miralax. The patient expressed agreement with this plan and was discharged in stable condition.patient notes CBC hepatic panel BMP in serum pregnancy test rolled negative for noted to be within normal limits . At this point we decided to treat doxycycline at this point as there was not excess of discharge to treat for [facility] but appropriate cultures were obtained      PROCEDURES:  None        FINAL IMPRESSION      1. Pelvic inflammatory disease          DISPOSITION/PLAN   Patient was discharged in stable condition. Will return if symptoms change or  worsen, or for any sign or symptom deemed emergent by the patient or family members. Follow up as an outpatient, sooner if symptoms warrant.    PATIENT REFERRED TO:  Anda Latina, MD  [address]  [address]  [place] [address]  [phone]    In 5 days  If symptoms worsen see sooner      DISCHARGE MEDICATIONS:  Discharge Medication List as of 02/25/2013 10:40 PM      START taking these medications    Details   doxycycline (ADOXA) 100 MG tablet Take 1 tablet by mouth 2 times daily for 10 days., Disp-20 tablet, R-0      HYDROcodone-acetaminophen (NORCO) 5-325 MG per tablet Take 1 tablet by mouth every 4 hours as needed for Pain for 20 doses., Disp-20 tablet, R-0      promethazine (PHENERGAN) 25 MG tablet Take 1 tablet by mouth every 6 hours as needed for Nausea for 20 doses. WARNING:  May cause drowsiness.  May impair ability to operate vehicles or machinery.  Do not use in combination with alcohol., Disp-20 tablet, R-0      polyethylene glycol (MIRALAX) powder Take 17 g by  mouth daily for 7 days. PRN constipation, Disp-1 Bottle, R-0             (Please note that portions of this note were completed with a voice recognition program.  Efforts were made to edit the dictations but occasionally words are mis-transcribed.)    Scribe:     This document serves as a record of the services and decisions personally  performed and made by Kennis Carina, MD. It was created on his behalf by Roland Earl, a trained medical scribe. The creation of this document is based the provider's statements to the medical scribe.    Roland Earl 02/25/2013 7:00 PM    Provider:    The information in this document, created by the medical scribe for me,   accurately reflects the services I personally performed and the decisions   made by me. I have reviewed and approved this document for accuracy   prior to leaving the patient care area.    Kennis Carina, MD 12:53 PM                        Kennis Carina, MD  03/05/13 1258

## 2013-02-25 NOTE — ED Notes (Signed)
Pt resp easy, unlabored.  Skin p/w/d.  Pt denies further needs.  Reviewed discharge instructions with pt.  Pt verbalizes understanding.  All questions answered.  Pt discharged home, ambulatory, stable to self.      Sherren Kerns, RN  02/25/13 2322

## 2013-02-25 NOTE — ED Notes (Signed)
Mini cath performed using sterile technique.  Urine specimen obtained and sent.  Pt tolerated procedure well with minimal discomfort.    Orest Dikes, RN  02/25/13 [phone]

## 2013-02-25 NOTE — Discharge Instructions (Signed)
Pelvic Pain  Pelvic pain is pain below the belly button and located between your hips. Acute pain may last a few hours or days. Chronic pelvic pain may last weeks and months. The cause may be different for different types of pain. The pain may be dull or sharp, mild or severe and can interfere with your daily activities. Write down and tell your caregiver:    Exactly where the pain is located.   If it comes and goes or is there all the time.   When it happens (with sex, urination, bowel movement, etc.)   If the pain is related to your menstrual period or stress.  Your caregiver will take a full history and do a complete physical exam and Pap test.  CAUSES    Painful menstrual periods (dysmenorrhea).   Normal ovulation (Mittelschmertz) that occurs in the middle of the menstrual cycle every month.   The pelvic organs get engorged with blood just before the menstrual period (pelvic congestive syndrome).   Scar tissue from an infection or past surgery (pelvic adhesions).   Cancer of the female pelvic organs. When there is pain with cancer, it has been there for a long time.   The lining of the uterus (endometrium) abnormally grows in places like the pelvis and on the pelvic organs (endometriosis).   A form of endometriosis with the lining of the uterus present inside of the muscle tissue of the uterus (adenomyosis).   Fibroid tumor (noncancerous) in the uterus.   Bladder problems such as infection, bladder spasms of the muscle tissue of the bladder.   Intestinal problems (irritable bowel syndrome, colitis, an ulcer or gastrointestinal infection).   Polyps of the cervix or uterus.   Pregnancy in the tube (ectopic pregnancy).   The opening of the cervix is too small for the menstrual blood to flow through it (cervical stenosis).   Physical or sexual abuse (past or present).   Musculo-skeletal problems from poor posture, problems with the vertebrae of the lower back or the uterine pelvic muscles falling  (prolapse).   Psychological problems such as depression or stress.   IUD (intrauterine device) in the uterus.  DIAGNOSIS   Tests to make a diagnosis depends on the type, location, severity and what causes the pain to occur. Tests that may be needed include:   Blood tests.   Urine tests   Ultrasound.   X-rays.   CT Scan.   MRI.   Laparoscopy.   Major surgery.  TREATMENT   Treatment will depend on the cause of the pain, which includes:   Prescription or over-the-counter pain medication.   Antibiotics.   Birth control pills.   Hormone treatment.   Nerve blocking injections.   Physical therapy.   Antidepressants.   Counseling with a psychiatrist or psychologist.   Minor or major surgery.  HOME CARE INSTRUCTIONS    Only take over-the-counter or prescription medicines for pain, discomfort or fever as directed by your caregiver.   Follow your caregiver's advice to treat your pain.   Rest.   Avoid sexual intercourse if it causes the pain.   Apply warm or cold compresses (which ever works best) to the pain area.   Do relaxation exercises such as yoga or meditation.   Try acupuncture.   Avoid stressful situations.   Try group therapy.   If the pain is because of a stomach/intestinal upset, drink clear liquids, eat a bland light food diet until the symptoms go away.  SEEK MEDICAL CARE   IF:    You need stronger prescription pain medication.   You develop pain with sexual intercourse.   You have pain with urination.   You develop a temperature of 102 F (38.9 C) with the pain.   You are still in pain after 4 hours of taking prescription medication for the pain.   You need depression medication.   Your IUD is causing pain and you want it removed.  SEEK IMMEDIATE MEDICAL CARE IF:   You develop very severe pain or tenderness.   You faint, have chills, severe weakness or dehydration.   You develop heavy vaginal bleeding or passing solid tissue.   You develop a temperature of 102 F (38.9 C)  with the pain.   You have blood in the urine.   You are being physically or sexually abused.   You have uncontrolled vomiting and diarrhea.   You are depressed and afraid of harming yourself or someone else.  Document Released: 10/29/2004 Document Revised: 12/14/2011 Document Reviewed: 07/26/2008  [employer] Patient Information 2013 [employer].

## 2013-02-26 LAB — URINE WITH REFLEXED MICRO
CASTS 2: NONE SEEN /lpf
Casts UA: >15 /lpf
Crystals, UA: NONE SEEN
Epithelial Cells, UA: 5 /hpf (ref 3–?)
Glucose, Ur: NEGATIVE mg/dl
Ketones, Urine: NEGATIVE
MISCELLANEOUS 2: NONE SEEN
Nitrite, Urine: NEGATIVE
Protein, UA: NEGATIVE
RBC, UA: 5 /hpf (ref 0–?)
Specific Gravity, Urine: 1.025 (ref 1.002–1.03)
Urobilinogen, Urine: 0.2 eu/dl (ref 0.0–1.0)
WBC, UA: 25 /hpf (ref 0–?)
Yeast, UA: NONE SEEN
pH, UA: 6.5 (ref 5.0–9.0)

## 2013-02-26 LAB — BILE ACIDS, TOTAL: Ictotest: NEGATIVE

## 2013-02-26 LAB — WET PREP, GENITAL

## 2013-02-26 LAB — KOH (SKIN,HAIR,NAILS)

## 2013-02-26 MED ORDER — HYDROCODONE-ACETAMINOPHEN 5-325 MG PO TABS
5-325 MG | ORAL_TABLET | ORAL | Status: DC | PRN
Start: 2013-02-26 — End: 2014-03-30

## 2013-02-26 MED ORDER — POLYETHYLENE GLYCOL 3350 17 GM/SCOOP PO POWD
17 GM/SCOOP | Freq: Every day | ORAL | Status: AC
Start: 2013-02-26 — End: 2013-03-04

## 2013-02-26 MED ORDER — PROMETHAZINE HCL 25 MG PO TABS
25 MG | ORAL_TABLET | Freq: Four times a day (QID) | ORAL | Status: AC | PRN
Start: 2013-02-26 — End: 2013-03-04

## 2013-02-26 MED ORDER — DOXYCYCLINE MONOHYDRATE 100 MG PO TABS
100 MG | ORAL_TABLET | Freq: Two times a day (BID) | ORAL | Status: AC
Start: 2013-02-26 — End: 2013-03-07

## 2013-02-26 MED ADMIN — HYDROcodone-acetaminophen (NORCO) 5-325 MG per tablet 1 tablet: 1 | ORAL | @ 01:00:00 | NDC 00406036523

## 2013-02-26 MED FILL — HYDROCODONE-ACETAMINOPHEN 5-325 MG PO TABS: 5-325 MG | ORAL | Qty: 1

## 2013-02-27 LAB — URINE CULTURE, REFLEXED

## 2013-03-03 LAB — CULTURE, GENITAL: Genital Culture, Routine: NO GROWTH — AB

## 2013-03-04 LAB — CHLAMYDIA CULTURE

## 2013-10-05 NOTE — L&D Delivery Note (Signed)
Brief Operative Report      Pre-operative Diagnosis:  Twins, PTL, Malpresentation    Post-operative Diagnosis:  Same    Procedure:  Primary LTC/S    Surgeon: Eulis Manly     Anesthesia:  Spinal Anesthesia    Estimated blood loss:  700 cc's     Findings: See Operative Dictation    Complications:  none      See dictated operative report for full details.      Javen Ridings C. Victory Strollo

## 2013-11-07 NOTE — ED Notes (Signed)
Discharge assessment complete. No changes.  All discharge education and information given. Pt instructed to go to ED for any shortness of breath, chest pain or Abd pain.  Verbalized Understanding. Left stable.    Concha PyoKimberly Seniah Lawrence, LPN  16/07/9601/03/15 [phone]

## 2013-11-07 NOTE — ED Notes (Signed)
Fell on ice about 2hrs ago. Noticeable deformity and swelling to right hand    Concha PyoKimberly Acen Craun, LPN  16/07/9601/03/15 [phone]

## 2013-11-07 NOTE — ED Provider Notes (Addendum)
[facility] encounter                                          CHIEF COMPLAINT       Chief Complaint   Patient presents with   ??? Fall     wrist- slipped on ice       Nurses Notes reviewed and I agree except as noted in the HPI.      HISTORY OF PRESENT ILLNESS    Jenny CornwallJessica M Kester is a 28 y.o. female who presents With the complaints of pain over the right wrist after slipping and falling on the ice 1 hour earlier today.  Denies having any other injury.  No back pain or any head pain.  No neck pain.  No tingling or numbness over the right hand.  She does give a history of right wrist injury in the past. PATIENT DID THE PREGNANCY TEST LAST WEEK AND IT WAS POSITIVE.    REVIEW OF SYSTEMS     Review of Systems   Constitutional: Negative for fever, chills and appetite change.   Cardiovascular: Negative for chest pain.   Gastrointestinal: Negative for abdominal pain.   Musculoskeletal: Negative for back pain, gait problem, neck pain and neck stiffness.        Patient has pain and swelling over the right wrist.   Neurological: Positive for dizziness, syncope and headaches.        PAST MEDICAL HISTORY   Patient  has a past medical history of Abnormal Pap smear.    SURGICAL HISTORY     Patient  has past surgical history that includes knee surgery (2009); Wisdom tooth extraction (2004); and fracture surgery.    CURRENT MEDICATIONS       Previous Medications    ACETAMINOPHEN (TYLENOL) 325 MG TABLET    Take 650 mg by mouth every 6 hours as needed.      HYDROCODONE-ACETAMINOPHEN (NORCO) 5-325 MG PER TABLET    Take 1 tablet by mouth every 4 hours as needed for Pain for 20 doses.    IBUPROFEN (IBU) 800 MG TABLET    Take 1 tablet by mouth every 8 hours as needed for Pain or Fever.    PRENATAL VITAMIN (PRENATAL-S) 27-0.8 MG TABS    Take 1 tablet by mouth daily.         ALLERGIES     Patient has No Known Allergies.    FAMILY HISTORY     Patient indicated that her mother is alive. She  indicated that her father is alive.  family history is not on file.    SOCIAL HISTORY     Patient  reports that she has been smoking Cigarettes.  She has a .125 pack-year smoking history. She does not have any smokeless tobacco history on file. She reports that she does not drink alcohol or use illicit drugs.    PHYSICAL EXAM     INITIAL VITALS:  weight is 175 lb (79.379 kg). Her temperature is 97.8 ??F (36.6 ??C). Her blood pressure is 148/74 and her pulse is 88. Her respiration is 16 and oxygen saturation is 98%.      Physical Exam   Constitutional: She is oriented to person, place, and time. She appears well-developed and well-nourished. No distress.   Musculoskeletal:   Patient has tenderness and swelling over the dorsum of the right wrist.  There is some deformity also.  Movements, sensation and circulation of the fingers and the thumb is normal.  She also has minimal tenderness over the upper forearm.  Elbow movements are normal and full.   Neurological: She is alert and oriented to person, place, and time.   Skin: No rash noted.   Psychiatric: She has a normal mood and affect.   Nursing note and vitals reviewed.      DIAGNOSTIC RESULTS     LABS:   No results found for this visit on 11/07/13.    Imaging    URGENT CARE COURSE:   Vitals:    Filed Vitals:    11/07/13 2006   BP: 148/74   Pulse: 88   Temp: 97.8 ??F (36.6 ??C)   Resp: 16   Weight: 175 lb (79.379 kg)   SpO2: 98%       Medications - No data to display  Since the patient was pregnant the possible risk to the fetus from the x-ray was explained to the patient.radiology technician was informed of this fact and she took adequate precautions.  PROCEDURES:  None    FINAL IMPRESSION      Problem List Items Addressed This Visit     None      Visit Diagnoses    Closed Colles' fracture    -  Primary             DISPOSITION/PLAN   X-ray shows fracture over in the radius.  Was given a short-arm splint on the right side.  Splint was checked it is okay.  The movement  sensation and circulation over the fingers is okay  Patient is advised to take Tylenol for pain relief since she is pregnant.  She is scheduled to follow visit with [facility] at 12:30 PM tomorrow.  Patient is encouraged to follow up with PCP. If symptoms worsen, become severe or new symptoms develop patient is instructed to go immediately to the emergency room for further evaluation and treatment.      PATIENT REFERRED TO:  [facility]  [address]  [address] [place] [address]    In 1 day        DISCHARGE MEDICATIONS:  New Prescriptions    No medications on file         Leea Rambeau V. Nelson Chimes, MD      Alfonso Patten, MD  11/07/13 2030    Alfonso Patten, MD  11/07/13 2036

## 2013-11-07 NOTE — Discharge Instructions (Signed)
Broken Wrist: After Your Visit  Your Care Instructions     Your wrist can break, or fracture, during sports, a fall, or other accidents. The break may happen when your wrist is hit or is used to protect you in a fall. Fractures can range from a small, hairline crack, to a bone or bones broken into two or more pieces. Your treatment depends on how bad the break is.  Your doctor may have put your wrist in a cast or splint. This will help keep your wrist stable until your follow-up appointment. It may take weeks or months for your wrist to heal. You can help it heal with care at home.  You heal best when you take good care of yourself. Eat a variety of healthy foods, and don't smoke.  Follow-up care is a key part of your treatment and safety. Be sure to make and go to all appointments, and call your doctor if you are having problems. It's also a good idea to know your test results and keep a list of the medicines you take.  How can you care for yourself at home?   Put ice or a cold pack on your wrist for 10 to 20 minutes at a time. Try to do this every 1 to 2 hours for the next 3 days (when you are awake). Put a thin cloth between the ice and your cast or splint. Keep your cast or splint dry.   Follow the splint or cast care instructions your doctor gives you. If you have a splint, do not take it off unless your doctor tells you to. Be careful not to put the splint on too tight.   Be safe with medicines. Take pain medicines exactly as directed.   If the doctor gave you a prescription medicine for pain, take it as prescribed.   If you are not taking a prescription pain medicine, ask your doctor if you can take an over-the-counter medicine.   Prop up your wrist on pillows when you sit or lie down in the first few days after the injury. Keep your wrist higher than the level of your heart. This will help reduce swelling.   Move your fingers often to reduce swelling and stiffness, but do not use that hand to  grab or carry anything.   Follow instructions for exercises to keep your arm strong.  When should you call for help?  Call your doctor now or seek immediate medical care if:   You have increased or severe pain.   Your cast or splint feels too tight.   You cannot move your fingers.   You have tingling, weakness, or numbness in your hand and fingers.   Your hand and fingers are cool or pale or change color.   You have a lot of swelling near your cast or splint.   The skin under your cast or splint is burning or stinging.  Watch closely for changes in your health, and be sure to contact your doctor if:   You do not get better as expected.   Where can you learn more?   Go to [email or web address] and sign in to your MyChart account. Enter J579 in the [place] box to learn more about "Broken Wrist: After Your Visit."    If you do not have an account, please click on the "Sign Up Now" link.      2006-2014 [employer], [employer]. Care instructions adapted under license by [facility]. This care instruction  is for use with your licensed healthcare professional. If you have questions about a medical condition or this instruction, always ask your healthcare professional. Healthwise, Incorporated disclaims any warranty or liability for your use of this information.  Content Version: 10.3.368381; Current as of: May 15, 2013

## 2013-11-08 ENCOUNTER — Inpatient Hospital Stay: Admit: 2013-11-08 | Discharge: 2013-11-08 | Disposition: A | Discharge status: Home / Self Care

## 2013-12-12 LAB — HIV SCREEN: HIV 1/2 Antibody: NONREACTIVE

## 2013-12-12 LAB — TSH WITH REFLEX TO FT4: TSH: 1.03 u[IU]/mL (ref 0.40–4.40)

## 2013-12-13 LAB — CULTURE, URINE

## 2013-12-14 LAB — PRENATAL PROFILE
Absolute Baso #: 0 10*3/uL (ref 0.0–0.1)
Absolute Eos #: 0 10*3/uL — ABNORMAL LOW (ref 0.1–0.4)
Absolute Lymph #: 1.7 10*3/uL (ref 0.8–5.2)
Absolute Mono #: 0.5 10*3/uL (ref 0.1–0.9)
Absolute Neut #: 10.8 10*3/uL — ABNORMAL HIGH (ref 1.3–9.1)
Antibody Screen: NEGATIVE
Basophils %: 0.2 % (ref 0–1)
Eosinophils %: 0.3 % — ABNORMAL LOW (ref 1–4)
Hematocrit: 41.9 % (ref 36.0–48.0)
Hemoglobin: 13.8 g/dL (ref 12.0–16.0)
Hepatitis B Surface Ag: NEGATIVE
Lymphocyte %: 13.2 % — ABNORMAL LOW (ref 16–48)
MCH: 31.7 pg (ref 27.0–34.0)
MCHC: 33 g/dl (ref 31.0–36.0)
MCV: 96 u3 (ref 80–100)
Monocytes: 3.6 %
Neutrophils %: 82.7 % — ABNORMAL HIGH (ref 45–75)
Platelets: 214 10*3/uL
RBC: 4.36 10*6/uL (ref 4.00–5.50)
RDW: 13.4 % (ref 10.8–14.8)
RPR Screen: NONREACTIVE
Rh Type: POSITIVE
Rubella Antibody IgG: 43 IU/ml
WBC: 13 10*3/uL — ABNORMAL HIGH (ref 3.7–10.8)

## 2014-01-11 NOTE — ED Notes (Addendum)
Pt comes to the ED tonight for c/o n/v/d x24 hrs. Pt states she is [redacted] weeks pregnant with twins. Pt states she has vomited every hour, on the hour and has developed diarrhea today as well. Pt states she had morning sickness with previous pregnancy but not to this point.    Jenny Ramirez  01/11/14 2209

## 2014-01-11 NOTE — ED Provider Notes (Signed)
[facility]  eMERGENCY dEPARTMENT eNCOUnter          CHIEF COMPLAINT       Chief Complaint   Patient presents with   ??? Emesis     x12 episodes in 24hrs, [redacted] weeks pregnant   ??? Diarrhea       Nurses Notes reviewed and I agree except as noted in the HPI.    HISTORY OF PRESENT ILLNESS    Jenny Ramirez is a 28 y.o. female who presents to the Emergency Department for the evaluation of emesis. The patient states that she is G 3 P 2 A 0 and is [redacted] weeks pregnant with twins. She states that today, she has had nausea with emesis. The patient also reports diarrhea which she describes as watery. She does report slight abdominal pain which she describes as intermittent cramping. Patient reports a worsening of pain with emesis. She denies any radiation of pain. Patient denies any fever, chills, cough, congestion, SOB, or light headedness.    The HPI was provided by the patient.      REVIEW OF SYSTEMS     Review of Systems   Constitutional: Negative for fever, chills and fatigue.   HENT: Negative for ear pain, rhinorrhea and sore throat.    Eyes: Negative for pain, discharge and visual disturbance.   Respiratory: Negative for cough, shortness of breath and wheezing.    Cardiovascular: Negative for chest pain, palpitations and leg swelling.   Gastrointestinal: Positive for nausea, vomiting, abdominal pain and diarrhea.   Genitourinary: Negative for dysuria, vaginal discharge, difficulty urinating and pelvic pain.   Musculoskeletal: Negative for back pain, joint swelling, arthralgias and neck pain.   Skin: Negative for rash.   Allergic/Immunologic: Negative for environmental allergies.   Neurological: Negative for dizziness, seizures, syncope and headaches.   Hematological: Negative for adenopathy.   Psychiatric/Behavioral: Negative for suicidal ideas and dysphoric mood. The patient is not nervous/anxious.         PAST MEDICAL HISTORY    has a past medical history of Abnormal Pap smear.    SURGICAL HISTORY       has past surgical history that includes knee surgery (2009); Wisdom tooth extraction (2004); and fracture surgery.    CURRENT MEDICATIONS       Discharge Medication List as of 01/12/2014  2:15 AM      CONTINUE these medications which have NOT CHANGED    Details   HYDROcodone-acetaminophen (NORCO) 5-325 MG per tablet Take 1 tablet by mouth every 4 hours as needed for Pain for 20 doses., Disp-20 tablet, R-0      acetaminophen (TYLENOL) 325 MG tablet Take 650 mg by mouth every 6 hours as needed.        ibuprofen (IBU) 800 MG tablet Take 1 tablet by mouth every 8 hours as needed for Pain or Fever., Disp-30 tablet, R-0      prenatal vitamin (PRENATAL-S) 27-0.8 MG TABS Take 1 tablet by mouth daily.               ALLERGIES     has No Known Allergies.    FAMILY HISTORY     indicated that her mother is alive. She indicated that her father is alive.  family history is not on file.    SOCIAL HISTORY      reports that she has been smoking Cigarettes.  She has a .125 pack-year smoking history. She does not have any smokeless tobacco history on file. She  reports that she does not drink alcohol or use illicit drugs.    PHYSICAL EXAM     INITIAL VITALS:  height is 5' 7"$  (1.702 m) and weight is 180 lb (81.647 kg). Her oral temperature is 98.1 ??F (36.7 ??C). Her blood pressure is 119/63 and her pulse is 85. Her respiration is 18 and oxygen saturation is 99%.    Physical Exam   Constitutional: She is oriented to person, place, and time. She appears well-developed and well-nourished.   HENT:   Head: Normocephalic.   Right Ear: External ear normal.   Left Ear: External ear normal.   Eyes: Conjunctivae are normal. Right eye exhibits no discharge. Left eye exhibits no discharge.   Neck: Normal range of motion. Neck supple. No JVD present.   Cardiovascular: Normal rate, regular rhythm and normal heart sounds.    No murmur heard.  Pulmonary/Chest: Effort normal and breath sounds normal. No respiratory distress. She has no wheezes. She has no  rales.   Abdominal: Bowel sounds are normal. There is tenderness in the epigastric area. There is no rebound and no guarding.   Epigastric tenderness. Fundus is palpable at the level of the umbilicus.   Musculoskeletal: Normal range of motion. She exhibits no edema.   Neurological: She is alert and oriented to person, place, and time. She exhibits normal muscle tone.   Skin: Skin is warm and dry. She is not diaphoretic. No erythema.   Psychiatric: She has a normal mood and affect. Her behavior is normal.   Nursing note and vitals reviewed.       DIFFERENTIAL DIAGNOSIS:   Discussed thoroughly with the patient at bedside    DIAGNOSTIC RESULTS     EKG: All EKG's are interpreted by the Emergency Department Physician who either signs or Co-signs this chart in the absence of a cardiologist.  None.    RADIOLOGY: non-plain film images(s) such as CT, Ultrasound and MRI are read by the radiologist.    Ultrasound of the abdomen for fetuses  Impression:     IMPRESSION:  1. Live intrauterine twin gestation.  2. Fetus B measures 5 days greater than fetus A. The size of both fetuses is appropriate for their gestational ages.           LABS:     Labs Reviewed   CBC WITH AUTO DIFFERENTIAL - Abnormal; Notable for the following:     WBC 15.7 (*)     MCH 32.7 (*)     MPV 10.5 (*)     Segs Absolute 14.5 (*)     Lymphocytes Absolute 0.6 (*)     All other components within normal limits   BASIC METABOLIC PANEL - Abnormal; Notable for the following:     CO2 17 (*)     All other components within normal limits   OSMOLALITY - Abnormal; Notable for the following:     Osmolality Calc 274.6 (*)     All other components within normal limits   ANION GAP - Abnormal; Notable for the following:     Anion Gap 22.8 (*)     All other components within normal limits   HEPATIC FUNCTION PANEL   LIPASE   GLOMERULAR FILTRATION RATE, ESTIMATED       EMERGENCY DEPARTMENT COURSE:   Vitals:    Filed Vitals:    01/11/14 2155 01/12/14 0005 01/12/14 0115   BP:  117/66 125/72 119/63   Pulse: 99 88 85   Temp: 98.1 ??F (36.7 ??C)  TempSrc: Oral     Resp: 18 20 18   $ Height: 5' 7"$  (1.702 m)     Weight: 180 lb (81.647 kg)     SpO2: 98% 98% 99%       11:38 PM: The patient was seen and evaluated. I stated that we would get labs and give medicine for nausea. Patient remained stable here in the emergency department. Patient's laboratory values, imaging studies, and physical examination findings were all reassuring. Upon re-evaluation, the patient is feeling better with a benign repeat examination. Patient was comfortable with the plan of discharge home to followup with the primary care provider in the next 2 days.  The patient is instructed to return to the emergency department for any worsening of their symptoms. Patient was discharged from the emergency department in good condition with all questions answered. See disposition below      CRITICAL CARE:   None.     CONSULTS:   None.    PROCEDURES:  None    FINAL IMPRESSION      1. Gastroenteritis          DISPOSITION/PLAN     Discharge home in stable condition.    PATIENT REFERRED TO:  your doctor    In 1 day        DISCHARGE MEDICATIONS:  Discharge Medication List as of 01/12/2014  2:15 AM      START taking these medications    Details   ondansetron (ZOFRAN-ODT) 4 MG disintegrating tablet Take 1 tablet by mouth every 8 hours as needed for Nausea or Vomiting., Disp-10 tablet, R-0      pantoprazole (PROTONIX) 20 MG tablet Take 1 tablet by mouth daily., Disp-15 tablet, R-0             (Please note that portions of this note were completed with a voice recognition program.  Efforts were made to edit the dictations but occasionally words are mis-transcribed.)      Scribe:  Carolee Rota 01/11/2014 11:38 PM Scribing for and in the presence of Channing Mutters, PA-C.    Provider:  I personally performed the services described in the documentation, reviewed and edited the documentation which was dictated to the scribe in my presence, and it  accurately records my words and actions.    Channing Mutters, PA-C 01/11/2014 11:34 PM                 Michell Heinrich, [place]  01/12/14 [phone]

## 2014-01-12 ENCOUNTER — Inpatient Hospital Stay
Admit: 2014-01-12 | Discharge: 2014-01-12 | Disposition: A | Discharge status: Home / Self Care | Attending: Emergency Medicine

## 2014-01-12 LAB — BASIC METABOLIC PANEL
BUN: 9 mg/dl (ref 7–22)
CO2: 17 meq/l — ABNORMAL LOW (ref 23–33)
Calcium: 9.4 mg/dl (ref 8.5–10.5)
Chloride: 102 meq/l (ref 98–111)
Creatinine: 0.4 mg/dl (ref 0.4–1.2)
Glucose: 102 mg/dl (ref 70–108)
Potassium: 3.8 meq/l (ref 3.5–5.2)
Sodium: 138 meq/l (ref 135–145)

## 2014-01-12 LAB — CBC WITH AUTO DIFFERENTIAL
Basophils Absolute: 0 10*3/uL (ref 0.0–0.1)
Basophils: 0.2 %
Eosinophils Absolute: 0 10*3/uL (ref 0.0–0.4)
Eosinophils: 0.1 %
Hematocrit: 43.3 % (ref 37.0–47.0)
Hemoglobin: 14.9 gm/dl (ref 12.0–16.0)
Lymphocytes Absolute: 0.6 10*3/uL — ABNORMAL LOW (ref 1.0–4.8)
Lymphocytes: 4.1 %
MCH: 32.7 pg — ABNORMAL HIGH (ref 27.0–31.0)
MCHC: 34.3 gm/dl (ref 33.0–37.0)
MCV: 95.3 fL (ref 81.0–99.0)
MPV: 10.5 mcm — ABNORMAL HIGH (ref 7.4–10.4)
Monocytes Absolute: 0.5 10*3/uL (ref 0.4–1.3)
Monocytes: 3.2 %
Platelets: 208 10*3/uL (ref 130–400)
RBC Morphology: NORMAL
RBC: 4.54 10*6/uL (ref 4.20–5.40)
RDW: 13.4 % (ref 11.5–14.5)
Seg Neutrophils: 92.4 %
Segs Absolute: 14.5 10*3/uL — ABNORMAL HIGH (ref 1.8–7.7)
WBC: 15.7 10*3/uL — ABNORMAL HIGH (ref 4.8–10.8)
nRBC: 0 /100 wbc

## 2014-01-12 LAB — HEPATIC FUNCTION PANEL
ALT: 12 U/L (ref 11–66)
AST: 14 U/L (ref 5–40)
Albumin: 3.8 gm/dl (ref 3.5–5.1)
Alkaline Phosphatase: 56 U/L (ref 38–126)
Bilirubin, Direct: <0.2 mg/dl (ref 0.0–0.3)
Total Bilirubin: 0.5 mg/dl (ref 0.3–1.2)
Total Protein: 7.6 gm/dl (ref 6.1–8.0)

## 2014-01-12 LAB — OSMOLALITY: Osmolality Calc: 274.6 mOsmol/kg — ABNORMAL LOW (ref 275.0–300)

## 2014-01-12 LAB — LIPASE: Lipase: 40.1 U/L (ref 5.6–51.3)

## 2014-01-12 LAB — ANION GAP: Anion Gap: 22.8 — ABNORMAL HIGH (ref 10.0–20.0)

## 2014-01-12 LAB — GLOMERULAR FILTRATION RATE, ESTIMATED: Est, Glom Filt Rate: >90 mL/min/{1.73_m2}

## 2014-01-12 MED ORDER — PANTOPRAZOLE SODIUM 20 MG PO TBEC
20 MG | ORAL_TABLET | Freq: Every day | ORAL | Status: DC
Start: 2014-01-12 — End: 2014-03-30

## 2014-01-12 MED ORDER — ONDANSETRON 4 MG PO TBDP
4 MG | ORAL_TABLET | Freq: Three times a day (TID) | ORAL | Status: DC | PRN
Start: 2014-01-12 — End: 2014-10-02

## 2014-01-12 MED ADMIN — ondansetron (ZOFRAN) injection 4 mg: 4 mg | INTRAVENOUS | @ 04:00:00 | NDC 36000001225

## 2014-01-12 MED ADMIN — 0.9 % sodium chloride bolus: 1000 mL | INTRAVENOUS | @ 04:00:00 | NDC 00338004904

## 2014-01-12 MED ADMIN — dicyclomine (BENTYL) capsule 20 mg: 20 mg | ORAL | @ 04:00:00 | NDC 00591079401

## 2014-01-12 MED ADMIN — pantoprazole (PROTONIX) injection 40 mg: 40 mg | INTRAVENOUS | @ 04:00:00 | NDC 00008400101

## 2014-01-12 MED ADMIN — ondansetron (ZOFRAN-ODT) disintegrating tablet 4 mg: 4 mg | ORAL | @ 06:00:00 | NDC 68001024616

## 2014-01-12 MED FILL — ONDANSETRON HCL 4 MG/2ML IJ SOLN: 4 MG/2ML | INTRAMUSCULAR | Qty: 2

## 2014-01-12 MED FILL — ONDANSETRON 4 MG PO TBDP: 4 MG | ORAL | Qty: 1

## 2014-01-12 MED FILL — PROTONIX 40 MG IV SOLR: 40 MG | INTRAVENOUS | Qty: 40

## 2014-01-12 MED FILL — DICYCLOMINE HCL 10 MG PO CAPS: 10 MG | ORAL | Qty: 2

## 2014-01-12 NOTE — Discharge Instructions (Signed)
Gastroenteritis: After Your Visit  Your Care Instructions  Gastroenteritis is an illness that may cause nausea, vomiting, and diarrhea. It is sometimes called "stomach flu." It can be caused by bacteria or a virus.  You will probably begin to feel better in 1 to 2 days. In the meantime, get plenty of rest and make sure you do not become dehydrated. Dehydration occurs when your body loses too much fluid.  Follow-up care is a key part of your treatment and safety. Be sure to make and go to all appointments, and call your doctor if you are having problems. It's also a good idea to know your test results and keep a list of the medicines you take.  How can you care for yourself at home?   If your doctor prescribed antibiotics, take them as directed. Do not stop taking them just because you feel better. You need to take the full course of antibiotics.   Drink plenty of fluids to prevent dehydration, enough so that your urine is light yellow or clear like water. Choose water and other caffeine-free clear liquids until you feel better. If you have kidney, heart, or liver disease and have to limit fluids, talk with your doctor before you increase your fluid intake.   Drink fluids slowly, in frequent, small amounts, because drinking too much too fast can cause vomiting.   Begin eating mild foods, such as dry toast, yogurt, applesauce, bananas, and rice. Avoid spicy, hot, or high-fat foods, and do not drink alcohol or caffeine for a day or two. Do not drink milk or eat ice cream until you are feeling better.  How to prevent gastroenteritis   Keep hot foods hot and cold foods cold.   Do not eat meats, dressings, salads, or other foods that have been kept at room temperature for more than 2 hours.   Use a thermometer to check your refrigerator. It should be between 34F and 40F.   Defrost meats in the refrigerator or microwave, not on the kitchen counter.   Keep your hands and your kitchen clean. Wash your hands,  cutting boards, and countertops with hot soapy water frequently.   Cook meat until it is well done.   Do not eat raw eggs or uncooked sauces made with raw eggs.   Do not take chances. If food looks or tastes spoiled, throw it out.  When should you call for help?  Call 911 anytime you think you may need emergency care. For example, call if:   You vomit blood or what looks like coffee grounds.   You passed out (lost consciousness).   You pass maroon or very bloody stools.  Call your doctor now or seek immediate medical care if:   You have severe belly pain.   You have signs of needing more fluids. You have sunken eyes, a dry mouth, and pass only a little dark urine.   You feel like you are going to faint.   You have increased belly pain that does not go away in 1 to 2 days.   You have new or increased nausea, or you are vomiting.   You have a new or higher fever.   Your stools are black and tarlike or have streaks of blood.  Watch closely for changes in your health, and be sure to contact your doctor if:   You are dizzy or lightheaded.   You urinate less than usual, or your urine is dark yellow or brown.   You do   not feel better with each day that goes by.   Where can you learn more?   Go to [email or web address] and sign in to your MyChart account. Enter N142 in the Search Health Information box to learn more about "Gastroenteritis: After Your Visit."    If you do not have an account, please click on the "Sign Up Now" link.      2006-2015 Healthwise, Incorporated. Care instructions adapted under license by [facility]. This care instruction is for use with your licensed healthcare professional. If you have questions about a medical condition or this instruction, always ask your healthcare professional. Health[employer], Incorporated disclaims any warranty or liability for your use of this information.  Content Version: 10.4.390249; Current as of: August 18, 2013

## 2014-01-12 NOTE — ED Provider Notes (Signed)
Physician Note:    Attending Supervisory Note/Shared Visit   I have personally performed a face to face diagnostic evaluation on this patient. I have reviewed the mid-level???s findings and agree.      (Please note that portions of this note were completed with a voice recognition program.  Efforts were made to edit the dictations but occasionally words are mis-transcribed.)    1:00 AM: The patient was evaluated.     Jenny Ramirez is a 28 y.o. female who presents to the Emergency Department for the evaluation of emesis. Patient is a G57P2 female currently [redacted] weeks pregnant with twins. She states that she has had vomiting, 12 episodes, starting today.She reports associated diarrhea. She denies any fevers, chills, abdominal pain, urinary changes, or vaginal bleeding or discharge. Patient denies any sick contacts at home. There are no modifying factors.     Exam:   Patient resting comfortably. Not toxic or ill appearing.   Head atraumatic and normocephalic  Heart S1, S2 regular rate and rhythm.   Lung sounds clear bilaterally, no wheezes, rales or rhonchi.   Abdomen is soft and not tender.   Zero edema to the bilateral lower extremities.          RADIOLOGY: non-plain film images(s) such as CT, Ultrasound and MRI are read by the radiologist.    An Ultrasound of the OB was obtained.  It is interpreted by the Radiologist.  [x]$  Radiologist's Wet Read Report Reviewed   []$  Discussed with Radiologist.    The interpretation is: live twin gestation    Patient seen and evaluated in the department for emesis during pregnancy. Physical exam was reassuring. Appropriate labs ordered and reviewed. Imaging studies showed live twin fetuses. All results discussed with patient and family at bedside. Patient treated in the department Zofran, bentyl, Protonix, and IV fluids. Patient discharged home in stable condition with a prescription for Zofran and Protonix. Marland Kitchen        FINAL IMPRESSION      1. Gastroenteritis        I saw this patient  with Channing Mutters, PA-C; I agree with his assessment and plan.    Scribe:    Tonye Pearson 01/12/2014 1:00 AM Scribing for and in the presence of Corky Sox, MD.    Provider:  I personally performed the services described in the documentation, reviewed and edited the documentation which was dictated to the scribe in my presence, and it accurately records my words and actions.    Corky Sox, MD 01/12/2014 2:17 AM                    Corky Sox, MD  01/12/14 [phone]

## 2014-01-12 NOTE — ED Notes (Signed)
Fetal heart tone 1=168 RLQ and fetal heart tone 2=152 LLQ performed by Deneise Lever RN     Dondra Prader, RN  01/12/14 [phone]

## 2014-01-12 NOTE — ED Notes (Signed)
Pt otf at this time in [place]    Cristopher PeruScott J Terrilyn Tyner, [place]  01/12/14 0127

## 2014-01-12 NOTE — ED Notes (Signed)
Pt here with c/o abd pain, nausea, emesis and diarrhea. Pt symptoms started this am and were cont all day so she decided to come in for evaluation. Pt is [redacted] weeks pregnant with twins per here report. Pt reports pain 4/10. Pt denies fever and reports decreased urinary frequency.     Cristopher PeruScott J Ereka Brau, RN  01/12/14 [phone]

## 2014-01-12 NOTE — ED Notes (Signed)
Pt updated on POC. Side rails up x2 and call light in reach. Pt expresses no further needs at this time; will cont to monitor. Emesis; and Diarrhea  has slightly improved.      Dondra Prader, RN  01/12/14 [phone]

## 2014-03-29 NOTE — Progress Notes (Signed)
Pt here for discomfort and the feeling "something is hanging out of my vagina" and pinkish tinge discharge since she had intercourse last night. Pt states having some constant cramping and feels babies move normally. Pt gowned, toco applied, dopplers done for FHTs , and POC discussed.

## 2014-03-29 NOTE — Other (Signed)
Pt given and reviewed discharge teaching. Advised pt to drink at least 8-10 glasses of water each day, rest, and to seek medical attention if rupture of membranes, bright red vaginal bleeding, decrease fetal movement after doing kick counts, and/or regular contractions every 2-5 minutes. Also advised to keep next schedule appt. Pt verbalized and signed understanding with all questions answered. EFM off, pt dressed, and off unit.

## 2014-03-29 NOTE — Plan of Care (Signed)
Problem: Anxiety:  Goal: Level of anxiety will decrease  Level of anxiety will decrease  Outcome: Ongoing  Pt calm with knowing no danger to babies or labor after having vaginal pain    Problem: Pain - Acute:  Goal: Able to cope with pain  Able to cope with pain  Outcome: Ongoing  Pt able to cope with discomfort of vaginal pain.    Comments:   Care plan reviewed with patient.  Patient verbalize understanding of the plan of care and contribute to goal setting.

## 2014-03-29 NOTE — Plan of Care (Signed)
Problem: Discharge Planning:  Goal: Discharged to appropriate level of care  Discharged to appropriate level of care  Outcome: Ongoing  Pt to be discharge home

## 2014-03-29 NOTE — Other (Signed)
Dr Luevenia MaxinScherger informed of his pt here and complaints. Advised vagina was visually inspected where pt states where it hurts and only external vaginal tissue noted, no abnormalities coming from inside vagina. Informed doppler FHTs and one contraction noted. New orders given.

## 2014-03-30 NOTE — Other (Signed)
Dr. Fawn KirkV. Stallkamp notified of admission and pt status. Order rec'd to do V/E and notify her results.

## 2014-03-30 NOTE — Other (Signed)
Both babies are very active. Unable to obtain continous tracings. No bleeding noted on labia or thighs.no contr tracing

## 2014-03-30 NOTE — Other (Signed)
Gr 3 p2 admitted to labor and delivery with twin preg. At 25 wks and 3 days. complaining of blood on TP this AM after using the rest room.states had intercourse day before yesterday. No pad worn to hospital. States is feeling cramping like she is about to start her period. Also complains of a sharp pain in LLQ since yesterday.

## 2014-03-30 NOTE — Other (Signed)
EFM off. Discharge instructions given. Instructed to go to Dr. Francene FindersV.Stallkamps office for further evaluation of cervix. Verbalizes understanding.

## 2014-03-30 NOTE — Other (Signed)
V/E done. Cx soft, closed. Un able to feel presenting part. No bleeding on glove post exam.

## 2014-03-30 NOTE — Plan of Care (Signed)
Problem: Anxiety:  Goal: Level of anxiety will decrease  Level of anxiety will decrease  Outcome: Ongoing  Pt states relief with hearing fhts and that there is no active bleeding.    Problem: Labor Process - Prolonged:  Goal: Uterine contractions within specified parameters  Uterine contractions within specified parameters  Outcome: Ongoing  No contractions tracing or palpated    Problem: Antenatal Screening:  Goal: Ability to make informed decisions regarding treatment has improved  Ability to make informed decisions regarding treatment has improved  Outcome: Ongoing  Pt is capable of making decisions regarding her care.    Problem: Discharge Planning:  Goal: Discharged to appropriate level of care  Discharged to appropriate level of care  Outcome: Ongoing  Discharge to v stallkamps office,    Comments:   Care plan reviewed with patient  Patient  verbalize understanding of the plan of care and contribute to goal setting.

## 2014-03-30 NOTE — Other (Signed)
Discharged ambulatory to dr stallkamps office.

## 2014-03-30 NOTE — Other (Signed)
Dr. Fawn KirkV. Stallkamp notified of V/E. Order rec'd to have pt come to office to be seen.

## 2014-03-30 NOTE — Other (Signed)
Plan of care discussed with pt. Verbalizes understanding.

## 2014-04-09 NOTE — Progress Notes (Signed)
[facility]                                    [place], [place]                                FETAL MONITOR STRIP NOTE     PATIENT NAME: Jenny Ramirez, Jenny M.            DOB:  June 18, 1986  MEDICAL RECORD NO: [id number]                 ROOM: 5C 0003  ACCOUNT NO: [id number]                          DATE: 03/29/2014  PHYSICIAN: Alexis GoodellWilliam E. Seniah Lawrence, [name]        The patient presents at [redacted] weeks gestation complaining of sharp vaginal pain.  There is no evidence for labor.  A fetal monitor strip was obtained and  showed fetal heart tones present without any evidence for significant uterine  activity. She was able to be discharged home.                 Alexis GoodellWilliam E. Curtez Brallier, [name]           D: 04/09/2014 08:17                                    T: 04/09/2014 08:31  eh

## 2014-04-13 NOTE — Progress Notes (Signed)
[facility]                                    [place], [place]                                     NONSTRESS TEST     PATIENT NAME: Quentin CornwallJenkins, Tyechia M.            DOB:  [birth year]  MEDICAL RECORD NO: [id number]                 ROOM: 5C 0006  ACCOUNT NO: [id number]                          DATE: 03/30/2014  PHYSICIAN: Erie NoeVanessa L. Catalina LungerStallkamp, M.D.        The patient is a 28 year old, G3, P2, at 2735 weeks gestation who presented  with spotting and a twin gestation.  While being evaluated, fetal heart tones  were obtained and she was not contracting.  She was allowed to be discharged  to the office for further evaluation.           Cheris Tweten L. Catalina LungerStallkamp, M.D.           D: 04/13/2014 16:56                                    T: 04/15/2014 22:23  cd     CC:  Erie NoeVanessa L. Catalina LungerStallkamp, M.D.  [address]  [place], [place] [id number]

## 2014-05-11 NOTE — Other (Signed)
Dr Eulis Manly called c-section, surgery and anesthesia on unit and informed of pending c-section

## 2014-05-11 NOTE — Other (Signed)
Pt presents to 5c06 with c/o contractions that she has had all day and became worse after going to work and working half of her shift.  Pt states she has only had 2 bottles of water to drink today.  Pt denies any vaginal bleeding or leaking of fluids, fetal movement noted per pt.  Pt placed on phone to admitting and then instructed to change into her gown, understanding voiced

## 2014-05-11 NOTE — Other (Signed)
GBS culture obtained

## 2014-05-11 NOTE — Other (Addendum)
Ice water given to pt and encouraged to drink

## 2014-05-11 NOTE — Other (Signed)
Dr Eulis ManlyS Stallkamp on unit ask if wanted a FFN and cervical exam to be done, order received

## 2014-05-11 NOTE — Plan of Care (Signed)
Problem: Anxiety:  Goal: Level of anxiety will decrease  Level of anxiety will decrease  Outcome: Ongoing  Pt calm and cooperative    Problem: Aspiration - Risk of:  Goal: Absence of aspiration  Absence of aspiration  Outcome: Ongoing  Lung sounds clear    Problem: Tissue Perfusion - Uteroplacental, Altered:  Goal: Absence of abnormal fetal heart rate pattern  Absence of abnormal fetal heart rate pattern  Outcome: Ongoing  Reactive fetal tracing obtained    Problem: Venous Thromboembolism - Risk of:  Goal: Will show no signs or symptoms of venous thromboembolism  Will show no signs or symptoms of venous thromboembolism  Outcome: Ongoing  Pt moving extremities with ease no swelling noted, bilateral SCD's applied    Problem: Discharge Planning:  Goal: Discharged to appropriate level of care  Discharged to appropriate level of care  Outcome: Ongoing  Pt to be taken to postpartum unit following completion of recovery period following her c-section    Comments:   Care plan reviewed with patient and Pam.  Patient and Pam verbalize understanding of the plan of care and contribute to goal setting.

## 2014-05-11 NOTE — Other (Signed)
Jenny Ramirez NNP at pts bedside

## 2014-05-11 NOTE — Other (Signed)
FFN collected and held

## 2014-05-11 NOTE — Other (Signed)
Labs drawn and sent to lab

## 2014-05-11 NOTE — Other (Signed)
Dr Eulis Manly called and made aware of pt of Dr Claudina Lick at 31+3 weeks with twins here with c/o contractions that started earlier this am and are now worse after working half of her shift, reactive fetal tracing noted, irregular contractions noted, pt is being followed for cervical lengths for a short cervix, physician also informed PO hydration was given to pt since she has only had 2 bottles of water today, order received for IV hydration and pain management when pt is discharged, pt is also to be off work till Monday and followup with Dr Copywriter, advertising at that time

## 2014-05-11 NOTE — Progress Notes (Addendum)
At the request of nursing staff, I was asked to provide information antenatally to Jenny Ramirez a 28 y.o. regarding premature birth and the associated neonatal complications of a twins @ 18 3/[redacted] weeks gestation delivery.    Discussed:   1. Indications for transfer to a tertiary center  2. Survival rates as reported by the [employer]  3. Fetal growth & development associated with [redacted]weeks gestation  4. Respiratory related complications:  RDS- etiology & treatment including surfactant administration & CPAP   Modes of oxygenation administration   Apnea of prematurity  5. Infections:    Most common types- sepsis, pneumonia & meningitis   Most common organism- GBS   How they are transmitted, detected & treated  6. Hyperbilirubinemia:  Production, metabolism and treatment  7. Feeding & Growth issues:  Mother's plan- breast milk or formula  Suck-swallow-breath coordination   Indications for parenteral vs enteral nutrition  Indications fortification & supplementation of breast milk feeding  Monitoring growth  PO vs NG  8. Thermoregulation issues  9. Anemia    Mother given opportunity to ask questions.  Verbalized understanding of premature birth and the associated neonatal complications.   Total time spent was 10 minutes    [place] K. SnyderCNP 8/7/2015now@ 2310

## 2014-05-11 NOTE — Other (Signed)
16 french foley cath inserted with clear yellow urine return

## 2014-05-11 NOTE — Other (Addendum)
SCN and NNP made aware of pending c-section delivery for twins with advanced cervix and malpresentation.  Dr Guadelupe Sabin made aware of pending delivery and to head in per Dena Billet CNP

## 2014-05-11 NOTE — Other (Signed)
Lab called and notified of need of stat lab draw, RN unable to draw because appropriate supplies not on unit at this time

## 2014-05-11 NOTE — Other (Signed)
Pt denies having contractions since she has been here

## 2014-05-12 ENCOUNTER — Inpatient Hospital Stay
Admit: 2014-05-12 | Disposition: A | Discharge status: Home / Self Care | Attending: Obstetrics & Gynecology | Admitting: Obstetrics & Gynecology

## 2014-05-12 DIAGNOSIS — IMO0001 Reserved for inherently not codable concepts without codable children: Secondary | ICD-10-CM

## 2014-05-12 LAB — CBC
Hematocrit: 33.6 % — ABNORMAL LOW (ref 37.0–47.0)
Hemoglobin: 11.3 gm/dl — ABNORMAL LOW (ref 12.0–16.0)
MCH: 32.8 pg — ABNORMAL HIGH (ref 27.0–31.0)
MCHC: 33.7 gm/dl (ref 33.0–37.0)
MCV: 97.3 fL (ref 81.0–99.0)
MPV: 9.7 mcm (ref 7.4–10.4)
Platelets: 176 10*3/uL (ref 130–400)
RBC: 3.45 10*6/uL — ABNORMAL LOW (ref 4.20–5.40)
RDW: 13.9 % (ref 11.5–14.5)
WBC: 11.3 10*3/uL — ABNORMAL HIGH (ref 4.8–10.8)

## 2014-05-12 LAB — RPR: RPR: NONREACTIVE

## 2014-05-12 LAB — TYPE AND SCREEN
GEL INDIRECT COOMBS: NEGATIVE
Rh Factor: POSITIVE

## 2014-05-12 LAB — AMNISURE: AMNISURE PATIENT RESULT: NEGATIVE

## 2014-05-12 MED ORDER — OXYCODONE-ACETAMINOPHEN 5-325 MG PO TABS
5-325 | ORAL | Status: DC | PRN
Start: 2014-05-12 — End: 2014-05-16
  Administered 2014-05-12 – 2014-05-16 (×16): 2 via ORAL

## 2014-05-12 MED ORDER — LACTATED RINGERS IV SOLN
INTRAVENOUS | Status: DC
Start: 2014-05-12 — End: 2014-05-12
  Administered 2014-05-12 (×2): via INTRAVENOUS

## 2014-05-12 MED ORDER — ONDANSETRON HCL 4 MG/2ML IJ SOLN
4 | Freq: Once | INTRAMUSCULAR | Status: DC | PRN
Start: 2014-05-12 — End: 2014-05-12

## 2014-05-12 MED ORDER — HYDROMORPHONE HCL 2 MG/ML IJ SOLN
2 | INTRAMUSCULAR | Status: DC | PRN
Start: 2014-05-12 — End: 2014-05-12

## 2014-05-12 MED ORDER — DIPHENHYDRAMINE HCL 25 MG PO TABS
25 | Freq: Four times a day (QID) | ORAL | Status: DC | PRN
Start: 2014-05-12 — End: 2014-05-16

## 2014-05-12 MED ORDER — OXYCODONE-ACETAMINOPHEN 5-325 MG PO TABS
5-325 | ORAL | Status: DC | PRN
Start: 2014-05-12 — End: 2014-05-12

## 2014-05-12 MED ORDER — ACETAMINOPHEN 325 MG PO TABS
325 | ORAL | Status: DC | PRN
Start: 2014-05-12 — End: 2014-05-16

## 2014-05-12 MED ORDER — SIMETHICONE 80 MG PO CHEW
80 | Freq: Four times a day (QID) | ORAL | Status: DC | PRN
Start: 2014-05-12 — End: 2014-05-16

## 2014-05-12 MED ORDER — RHO D IMMUNE GLOBULIN 1500 UNITS IM SOSY
1500 | Freq: Once | INTRAMUSCULAR | Status: DC
Start: 2014-05-12 — End: 2014-05-16

## 2014-05-12 MED ORDER — ONDANSETRON 4 MG PO TBDP
4 | Freq: Four times a day (QID) | ORAL | Status: DC | PRN
Start: 2014-05-12 — End: 2014-05-16

## 2014-05-12 MED ORDER — MORPHINE SULFATE (PF) 2 MG/ML IV SOLN
2 | INTRAVENOUS | Status: DC | PRN
Start: 2014-05-12 — End: 2014-05-16

## 2014-05-12 MED ORDER — FERROUS SULFATE 325 (65 FE) MG PO TABS
325 | Freq: Every day | ORAL | Status: DC
Start: 2014-05-12 — End: 2014-05-16

## 2014-05-12 MED ORDER — LACTATED RINGERS IV SOLN
INTRAVENOUS | Status: DC
Start: 2014-05-12 — End: 2014-05-16
  Administered 2014-05-12 (×2): via INTRAVENOUS

## 2014-05-12 MED ORDER — LABETALOL HCL 5 MG/ML IV SOLN
5 | INTRAVENOUS | Status: DC | PRN
Start: 2014-05-12 — End: 2014-05-12

## 2014-05-12 MED ORDER — METHYLERGONOVINE MALEATE 0.2 MG/ML IJ SOLN
0.2 | INTRAMUSCULAR | Status: DC | PRN
Start: 2014-05-12 — End: 2014-05-16

## 2014-05-12 MED ORDER — DOCUSATE SODIUM 100 MG PO CAPS
100 | Freq: Two times a day (BID) | ORAL | Status: DC
Start: 2014-05-12 — End: 2014-05-16
  Administered 2014-05-13 – 2014-05-16 (×7): 100 mg via ORAL

## 2014-05-12 MED ORDER — MEASLES, MUMPS & RUBELLA VAC SC INJ
SUBCUTANEOUS | Status: DC
Start: 2014-05-12 — End: 2014-05-16

## 2014-05-12 MED ORDER — ONDANSETRON HCL 4 MG/2ML IJ SOLN
4 | Freq: Three times a day (TID) | INTRAMUSCULAR | Status: DC | PRN
Start: 2014-05-12 — End: 2014-05-12

## 2014-05-12 MED ORDER — OXYTOCIN 30 UNITS IN 500 ML INFUSION
30 | INTRAVENOUS | Status: DC
Start: 2014-05-12 — End: 2014-05-12
  Administered 2014-05-12: 05:00:00 50 m[IU]/min via INTRAVENOUS

## 2014-05-12 MED ORDER — LANOLIN EX OINT
CUTANEOUS | Status: DC | PRN
Start: 2014-05-12 — End: 2014-05-16

## 2014-05-12 MED ORDER — LACTATED RINGERS IV SOLN
INTRAVENOUS | Status: DC
Start: 2014-05-12 — End: 2014-05-11
  Administered 2014-05-12: 02:00:00 via INTRAVENOUS

## 2014-05-12 MED ORDER — IBUPROFEN 800 MG PO TABS
800 | Freq: Three times a day (TID) | ORAL | Status: DC | PRN
Start: 2014-05-12 — End: 2014-05-16
  Administered 2014-05-13 – 2014-05-16 (×6): 800 mg via ORAL

## 2014-05-12 MED ORDER — ONDANSETRON HCL 4 MG/2ML IJ SOLN
4 | Freq: Three times a day (TID) | INTRAMUSCULAR | Status: DC | PRN
Start: 2014-05-12 — End: 2014-05-16

## 2014-05-12 MED ORDER — OXYCODONE-ACETAMINOPHEN 5-325 MG PO TABS
5-325 | ORAL | Status: DC | PRN
Start: 2014-05-12 — End: 2014-05-16
  Administered 2014-05-13 – 2014-05-14 (×4): 1 via ORAL

## 2014-05-12 MED ORDER — OXYTOCIN 30 UNITS IN 500 ML INFUSION
30 | INTRAVENOUS | Status: DC
Start: 2014-05-12 — End: 2014-05-16

## 2014-05-12 MED ORDER — TETANUS-DIPHTH-ACELL PERTUSSIS 5-2.5-18.5 LF-MCG/0.5 IM SUSP
INTRAMUSCULAR | Status: AC
Start: 2014-05-12 — End: 2014-05-16
  Administered 2014-05-16: 16:00:00 0.5 mL via INTRAMUSCULAR

## 2014-05-12 MED ORDER — HYDRALAZINE HCL 20 MG/ML IJ SOLN
20 | INTRAMUSCULAR | Status: DC | PRN
Start: 2014-05-12 — End: 2014-05-12

## 2014-05-12 MED ORDER — CITRIC ACID-SODIUM CITRATE 334-500 MG/5ML PO SOLN
334-500 | Freq: Once | ORAL | Status: AC
Start: 2014-05-12 — End: 2014-05-12
  Administered 2014-05-12: 04:00:00 30 mL via ORAL

## 2014-05-12 MED ORDER — ZOLPIDEM TARTRATE 10 MG PO TABS
10 | Freq: Every evening | ORAL | Status: DC | PRN
Start: 2014-05-12 — End: 2014-05-16

## 2014-05-12 MED ORDER — NORMAL SALINE FLUSH 0.9 % IV SOLN
0.9 | INTRAVENOUS | Status: DC | PRN
Start: 2014-05-12 — End: 2014-05-16

## 2014-05-12 MED ORDER — BISACODYL 10 MG RE SUPP
10 | Freq: Every day | RECTAL | Status: DC | PRN
Start: 2014-05-12 — End: 2014-05-16

## 2014-05-12 MED ORDER — KETOROLAC TROMETHAMINE 30 MG/ML IJ SOLN
30 | Freq: Four times a day (QID) | INTRAMUSCULAR | Status: AC
Start: 2014-05-12 — End: 2014-05-14
  Administered 2014-05-12 – 2014-05-13 (×4): 30 mg via INTRAVENOUS

## 2014-05-12 MED ORDER — HYDROMORPHONE HCL PF 1 MG/ML IJ SOLN
1 | INTRAMUSCULAR | Status: DC | PRN
Start: 2014-05-12 — End: 2014-05-16

## 2014-05-12 MED ORDER — FENTANYL CITRATE 0.05 MG/ML IJ SOLN
0.05 | INTRAMUSCULAR | Status: DC | PRN
Start: 2014-05-12 — End: 2014-05-12

## 2014-05-12 MED ORDER — CEFAZOLIN SODIUM-DEXTROSE 2-3 GM-% IV SOLR
2-3 | INTRAVENOUS | Status: AC
Start: 2014-05-12 — End: 2014-05-12
  Administered 2014-05-12: 04:00:00 2 g via INTRAVENOUS

## 2014-05-12 MED ORDER — NORMAL SALINE FLUSH 0.9 % IV SOLN
0.9 | Freq: Two times a day (BID) | INTRAVENOUS | Status: DC
Start: 2014-05-12 — End: 2014-05-16

## 2014-05-12 MED ORDER — CARBOPROST TROMETHAMINE 250 MCG/ML IM SOLN
250 | INTRAMUSCULAR | Status: DC | PRN
Start: 2014-05-12 — End: 2014-05-16

## 2014-05-12 MED ADMIN — oxyCODONE-acetaminophen (PERCOCET) 5-325 MG per tablet 2 tablet: 2 | ORAL | @ 06:00:00 | NDC 00406051223

## 2014-05-12 MED ADMIN — ketorolac (TORADOL) 30 MG/ML injection: 30 | INTRAMUSCULAR | @ 06:00:00 | NDC 63323016201

## 2014-05-12 MED FILL — KETOROLAC TROMETHAMINE 30 MG/ML IJ SOLN: 30 MG/ML | INTRAMUSCULAR | Qty: 1

## 2014-05-12 MED FILL — ONDANSETRON 4 MG PO TBDP: 4 MG | ORAL | Qty: 1

## 2014-05-12 MED FILL — OXYCODONE-ACETAMINOPHEN 5-325 MG PO TABS: 5-325 MG | ORAL | Qty: 2

## 2014-05-12 MED FILL — ONDANSETRON HCL 4 MG/2ML IJ SOLN: 4 MG/2ML | INTRAMUSCULAR | Qty: 2

## 2014-05-12 MED FILL — CEFAZOLIN SODIUM-DEXTROSE 2-3 GM-% IV SOLR: 2-3 GM-% | INTRAVENOUS | Qty: 2

## 2014-05-12 MED FILL — OXYTOCIN 10 UNIT/ML IJ SOLN: 10 UNIT/ML | INTRAMUSCULAR | Qty: 2

## 2014-05-12 MED FILL — CITRIC ACID-SODIUM CITRATE 334-500 MG/5ML PO SOLN: 334-500 MG/5ML | ORAL | Qty: 30

## 2014-05-12 NOTE — Other (Signed)
Patient admitted to 5B21 from L&D via wheelchair. Report received by Aundra Millet, RN. Assisted into bed. Oriented to room and plan of care. Verbalized understanding. Call light and bedside table within reach. Side rails up X2.  No concerns or questions voiced.

## 2014-05-12 NOTE — Plan of Care (Signed)
Problem: Discharge Planning:  Goal: Discharged to appropriate level of care  Discharged to appropriate level of care   Outcome: Ongoing  Not discharged today    Problem: Fluid Volume - Imbalance:  Goal: Absence of postpartum hemorrhage signs and symptoms  Absence of postpartum hemorrhage signs and symptoms   Outcome: Ongoing  Small vaginal bleed  Goal: Absence of imbalanced fluid volume signs and symptoms  Absence of imbalanced fluid volume signs and symptoms   Outcome: Ongoing  Foley catheter dreining well,running iv fluids    Problem: Infection - Surgical Site:  Goal: Will show no infection signs and symptoms  Will show no infection signs and symptoms   Outcome: Ongoing  Vitals wnl,abd. Dressing dry and intact    Problem: Mood - Altered:  Goal: Mood stable  Mood stable   Outcome: Ongoing  Pleasant,to scn to visit infant    Problem: Nausea/Vomiting:  Goal: Absence of nausea/vomiting  Absence of nausea/vomiting   Outcome: Ongoing  none    Problem: Pain - Acute:  Goal: Pain level will decrease  Pain level will decrease   Outcome: Ongoing  Patient states pain controled with pain meds    Problem: Urinary Retention:  Goal: Urinary elimination within specified parameters  Urinary elimination within specified parameters   Outcome: Ongoing  Foley catheter draining well    Problem: Venous Thromboembolism:  Goal: Will show no signs or symptoms of venous thromboembolism  Will show no signs or symptoms of venous thromboembolism   Outcome: Ongoing  Homans negative  Goal: Absence of signs or symptoms of impaired coagulation  Absence of signs or symptoms of impaired coagulation   Outcome: Ongoing  Voices no complaint of calf tenderness    Comments:   Care plan reviewed with patient .  Patient  verbalize understanding of the plan of care and contribute to goal setting.

## 2014-05-12 NOTE — Plan of Care (Signed)
Problem: Discharge Planning:  Goal: Discharged to appropriate level of care  Discharged to appropriate level of care  Outcome: Ongoing  Nurse and patient working toward discharge. Ducks in a row     Problem: Fluid Volume - Imbalance:  Goal: Absence of postpartum hemorrhage signs and symptoms  Absence of postpartum hemorrhage signs and symptoms  Outcome: Ongoing  See flowsheet   Goal: Absence of imbalanced fluid volume signs and symptoms  Absence of imbalanced fluid volume signs and symptoms  Outcome: Ongoing  Intake and output QS    Problem: Infection - Surgical Site:  Goal: Will show no infection signs and symptoms  Will show no infection signs and symptoms  Outcome: Ongoing  No S/S of infection noted. Will continue to monitor.     Problem: Mood - Altered:  Goal: Mood stable  Mood stable  Outcome: Ongoing  Calm and cooperative     Problem: Nausea/Vomiting:  Goal: Absence of nausea/vomiting  Absence of nausea/vomiting  Outcome: Ongoing  None this shift.     Problem: Pain - Acute:  Goal: Pain level will decrease  Pain level will decrease  Outcome: Ongoing  Pain managed with motrin and percocet     Problem: Urinary Retention:  Goal: Urinary elimination within specified parameters  Urinary elimination within specified parameters  Outcome: Ongoing  Foley catheter in place, patent     Problem: Venous Thromboembolism:  Goal: Will show no signs or symptoms of venous thromboembolism  Will show no signs or symptoms of venous thromboembolism  Outcome: Ongoing  Homans sign negative   Goal: Absence of signs or symptoms of impaired coagulation  Absence of signs or symptoms of impaired coagulation  Outcome: Ongoing  No S/S of impaired coagulation noted     Comments:   Care plan reviewed with patient.  Patient verbalize understanding of the plan of care and contribute to goal setting.

## 2014-05-12 NOTE — Other (Signed)
Dr S Stallkamp at bedside

## 2014-05-12 NOTE — Other (Signed)
Foley catheter removed without resistance,peri care done 70ml of clear,yellow urine in catheter bag.

## 2014-05-12 NOTE — Other (Signed)
Dr Guadelupe Sabinatad at pts bedside

## 2014-05-12 NOTE — Other (Signed)
pericare provided clean pad put in place small amount of rubra lochia noted

## 2014-05-12 NOTE — Other (Signed)
Pt taken to SCN via bed to see newborn sons

## 2014-05-12 NOTE — H&P (Signed)
H&P Update    Patient's History and Physical from my office was reviewed.    Patient examined.    There has been labor (8 cms on presentation to L&D) with malpresentation of baby A.    Jenny Ramirez C. Miklos Bidinger

## 2014-05-12 NOTE — Other (Addendum)
To OR per bed

## 2014-05-12 NOTE — Other (Signed)
Report called to [employer] RN

## 2014-05-12 NOTE — Op Note (Signed)
[facility]                                    [place], [place]                                   RECORD OF OPERATION     PATIENT NAME: Jenny Ramirez, Jenny Ramirez.            DOB: [birth year]  MEDICAL RECORD NO. [id number]                 ROOM: 5B 0021  ACCOUNT NO: [id number]                          DATE: 05/12/2014  SURGEON: Lorin Picket C. Catalina Lunger, M.D.        DATE OF DELIVERY:  05/12/2014.     PREOPERATIVE DIAGNOSIS:  31 and 3/7th weeks twins with malpresentation with  small parts presenting, 8 cm, in active labor.     POSTOPERATIVE DIAGNOSIS:  31 and 3/7th weeks twins with malpresentation with  small parts presenting, 8 cm, in active labor.     PROCEDURE PERFORMED:  Primary low transverse C section via Pfannenstiel  incision.     SURGEON:  Dr. Nadara Mode.     ANESTHESIA:  Spinal, Dr. Tillman Abide.     ESTIMATED BLOOD LOSS:  700 ml.     FINDINGS:  Baby A was female, double footling breech, 2 pounds 13 ounces,  Apgars 8 at 1 minute and 9 at 5 minutes.  Baby B was also female, transverse  backup, 3 pounds 8 ounces with Apgars pending at the time of dictation.     PROCEDURE:  The patient was taken to the operating room at which time she had  her spinal placed.  She was then placed in dorsal supine position, sterilely  prepped and draped in the usual manner.  A scalpel was used to make a  Pfannenstiel skin incision.  This was carried down through the underlying  tissue to the fascia.  The fascia was entered sharply with the Bovie on cut  and the underlying rectus muscle split in the midline.  Peritoneum was  entered.  Bladder flap was then incised.  Bladder blade was placed.  The  scalpel was used to make a transverse incision in the lower uterine segment  and this carried out laterally digitally.  A hand was then placed into the  uterus and the breech was then brought through.  Proper breech maneuvers then  delivered the body followed by the after coming head.  The baby was then bulb  suction on the field.   The cord was doubly clamped and cut, and the infant  was handed away to the waiting Special Care Nurses for triage.  Cord blood  was obtained.  A hand was then placed in the uterus.  The transverse backed  up presentation was palpated.  The head was brought down to the lower uterine  segment and was delivered without complications.  The mouth and nose were  bulb suction.  The anterior shoulder followed by the posterior shoulder were  delivered without complications followed by remainder of the infant.  This  female infant was crying on the field.  The cord was doubly clamped and cut,  and the infant was  handed away to the waiting Special Care Nurses for triage.  Cord blood was obtained.  A cord clamp was then placed on Baby A's cord for  identification and pathology.  The uterus was exteriorized.  On inspection,  the uterine incision was remote from bilateral uterine vessels and was closed  with 0 chromic starting at each angle and tying in the midline.  A second  imbricating stitch was then placed for uterine scar reinforcement.  The  uterus was then placed back to the pelvis.  The pelvis was irrigated with  copious amounts of sterile saline.  All surgical sites were inspected and  found to be hemostatic.  The bladder flap was then closed with running 3-0  Vicryl.  Peritoneum was closed with running 3-0 Vicryl.  The fascia was then  closed with #1 Vicryl starting at each angle and tying in the midline.  The  fascia was palpated and found to be free of any defects.  The skin was then  irrigated and any small bleeding coagulated with the Bovie.  The skin was  then closed using skin staples.  Final sponge, needle and instrument counts  were reported as correct x2.  The patient then had sterile dressing applied.  She was undraped, transferred to the cart and taken to the recovery room in  stable condition.                                   Velena Keegan C. Catalina LungerStallkamp, M.D.        D: 05/12/2014 01:19                                     T: 05/12/2014 07:50  jr     CC:  Lorin PicketScott C. Catalina LungerStallkamp, M.D.  [address]  [place], [place] [id number]     Alexis GoodellWilliam E. Scherger, M.D.  [address]  [place], [place] [id number]

## 2014-05-12 NOTE — Other (Signed)
saton side of bed,ambulated in room,up in w/c to scn

## 2014-05-12 NOTE — Anesthesia Pre-Procedure Evaluation (Addendum)
[facility]  PRE-ANESTHESIA EVALUATION FORM       Name:  Jenny Ramirez                                         Age:  28 y.o.  MRN:  [id number]           No Known Allergies  Patient Active Problem List   Diagnosis   ??? Active labor   ??? Gastroenteritis     Past Medical History   Diagnosis Date   ??? Abnormal Pap smear 2010   ??? Gonorrhea 12/04/2013   ??? Chlamydia 12/04/2013   ??? Kidney stones      Past Surgical History   Procedure Laterality Date   ??? Knee surgery  2009   ??? Wisdom tooth extraction  2004   ??? Fracture surgery       Reconstruction of Right knee cap     History   Substance Use Topics   ??? Smoking status: Current Every Day Smoker -- 0.25 packs/day for 10 years     Types: Cigarettes   ??? Smokeless tobacco: Not on file   ??? Alcohol Use: No     Medications  Current Facility-Administered Medications on File Prior to Encounter   Medication Dose Route Frequency Provider Last Rate Last Dose   ??? acetaminophen (TYLENOL) tablet 650 mg  650 mg Oral Q4H PRN Nadara ModeScott Stallkamp, MD       ??? ondansetron (ZOFRAN) injection 4 mg  4 mg Intravenous Q6H PRN Nadara ModeScott Stallkamp, MD         Current Outpatient Prescriptions on File Prior to Encounter   Medication Sig Dispense Refill   ??? acetaminophen (TYLENOL) 325 MG tablet Take 650 mg by mouth every 6 hours as needed.         ??? prenatal vitamin (PRENATAL-S) 27-0.8 MG TABS Take 1 tablet by mouth daily.         ??? ondansetron (ZOFRAN-ODT) 4 MG disintegrating tablet Take 1 tablet by mouth every 8 hours as needed for Nausea or Vomiting.  10 tablet  0     Current Facility-Administered Medications   Medication Dose Route Frequency Provider Last Rate Last Dose   ??? lactated ringers infusion   Intravenous Continuous Nadara ModeScott Stallkamp, MD 999 mL/hr at 05/11/14 2327     ??? citric acid-sodium citrate (BICITRA) solution 30 mL  30 mL Oral Once Nadara ModeScott Stallkamp, MD       ??? ceFAZolin (ANCEF) 2 g in dextrose 3% 50mL IVPB (duplex)  2 g Intravenous On Call to OR Nadara ModeScott Stallkamp, MD       ??? oxytocin  (PITOCIN) 30 units in 500 mL infusion  1 milli-units/min Intravenous Continuous Nadara ModeScott Stallkamp, MD       ??? ondansetron (ZOFRAN) injection 8 mg  8 mg Intravenous Q8H PRN Nadara ModeScott Stallkamp, MD         Facility-Administered Medications Ordered in Other Encounters   Medication Dose Route Frequency Provider Last Rate Last Dose   ??? acetaminophen (TYLENOL) tablet 650 mg  650 mg Oral Q4H PRN Nadara ModeScott Stallkamp, MD       ??? ondansetron (ZOFRAN) injection 4 mg  4 mg Intravenous Q6H PRN Nadara ModeScott Stallkamp, MD         Vitals   Filed Vitals:    05/11/14 2201   BP: 116/61   Pulse: 90   Temp:  BP Readings from Last 3 Encounters:   05/11/14 116/61   03/30/14 112/64   03/29/14 115/75     BMI  Ht Readings from Last 1 Encounters:   05/11/14 5\' 7"  (1.702 m)     Wt Readings from Last 1 Encounters:   05/11/14 189 lb (85.73 kg)     Body mass index is 29.59 kg/(m^2).  Estimated body mass index is 29.59 kg/(m^2) as calculated from the following:    Height as of this encounter: 5\' 7"  (1.702 m).    Weight as of this encounter: 189 lb (85.73 kg).    CBC   Lab Results   Component Value Date    WBC 15.7 01/12/2014    WBC 13.0 12/11/2013    RBC 4.54 01/12/2014    RBC 4.36 12/11/2013    HGB 14.9 01/12/2014    HCT 43.3 01/12/2014    MCV 95.3 01/12/2014    RDW 13.4 01/12/2014    PLT 208 01/12/2014    PLT 214 12/11/2013     CMP    Lab Results   Component Value Date    NA 138 01/12/2014    NA 138 10/09/2010    K 3.8 01/12/2014    K 3.9 10/09/2010    CL 102 01/12/2014    CO2 17 01/12/2014    BUN 9 01/12/2014    BUN 7 05/24/2010    CREATININE 0.4 01/12/2014    CREATININE 0.5 10/09/2010    CREATININE 0.8 05/24/2010    LABGLOM > 90 01/12/2014    GLUCOSE 102 01/12/2014    GLUCOSE 91 05/24/2010    PROT 7.6 01/12/2014    CALCIUM 9.4 01/12/2014    BILITOT 0.5 01/12/2014    BILITOT 0.4 10/09/2010    ALKPHOS 56 01/12/2014    AST 14 01/12/2014    ALT 12 01/12/2014     BMP    Lab Results   Component Value Date    NA 138 01/12/2014    NA 138 10/09/2010    K 3.8 01/12/2014    K 3.9 10/09/2010    CL 102 01/12/2014     CO2 17 01/12/2014    BUN 9 01/12/2014    BUN 7 05/24/2010    CREATININE 0.4 01/12/2014    CREATININE 0.5 10/09/2010    CREATININE 0.8 05/24/2010    CALCIUM 9.4 01/12/2014    LABGLOM > 90 01/12/2014    GLUCOSE 102 01/12/2014    GLUCOSE 91 05/24/2010     POCGlucose  No results for input(s): GLUCOSE in the last 72 hours.   Coags    No results found for this basename: PROTIME, INR, APTT     HCG (If Applicable)   Lab Results   Component Value Date    PREGTESTUR POSITIVE 09/21/2010    PREGSERUM NEGATIVE 02/25/2013      ABGs   No results found for this basename: PHART, PO2ART, PCO2ART, HCO3ART, BEART, O2SATART      Type & Screen (If Applicable)  Lab Results   Component Value Date    LABABO A 12/11/2013    LABRH Rh Positive 09/26/2010     EKG (If Applicable)   CXR (If Applicable)   Cardiac Testing (If Applicable)        Anesthesia Evaluation      Airway   Mallampati: I  TM distance: >3 FB  Dental - normal exam     Pulmonary - normal exam   (-) COPD, asthma, shortness of breath, sleep apnea  Cardiovascular -  normal exam  (-) hypertension, valvular problems/murmurs, past MI, CAD, CABG/stent  Beta Blocker:  Not on Beta Blocker    Neuro/Psych    (-) seizures, neuromuscular disease, TIA  GI/Hepatic/Renal    (-) GERD, liver disease, renal disease    Endo/Other    (-) no type I diabetes, no type II diabetes, hypothyroidism, hyperthyroidism  Abdominal                   Allergies: Review of patient's allergies indicates no known allergies.    NPO Status: Time of last liquid consumption: 2000                       Time of last solid food consumption: 2000            Date of last liquid consumption: 05/11/14            Date of last solid food consumption: 05/11/14    Anesthesia Plan    ASA 2 - emergent     spinal     Anesthetic plan and risks discussed with patient.          Natale Lay, MD  05/12/2014

## 2014-05-12 NOTE — Other (Signed)
Dr Imm at pts bedside

## 2014-05-12 NOTE — Other (Signed)
Up to bathroom,voided,instructed on peri care.ambulated well in room

## 2014-05-12 NOTE — Other (Signed)
Up to bathroom for 2nd time with nurse assist.  Did own peri care.  Tolerated well.  Taken to [facility] by wheelchair to see babies.

## 2014-05-13 MED FILL — ONDANSETRON 4 MG PO TBDP: 4 MG | ORAL | Qty: 1

## 2014-05-13 MED FILL — DOCUSATE SODIUM 100 MG PO CAPS: 100 MG | ORAL | Qty: 1

## 2014-05-13 MED FILL — OXYCODONE-ACETAMINOPHEN 5-325 MG PO TABS: 5-325 MG | ORAL | Qty: 2

## 2014-05-13 MED FILL — KETOROLAC TROMETHAMINE 30 MG/ML IJ SOLN: 30 MG/ML | INTRAMUSCULAR | Qty: 1

## 2014-05-13 MED FILL — OXYCODONE-ACETAMINOPHEN 5-325 MG PO TABS: 5-325 MG | ORAL | Qty: 1

## 2014-05-13 MED FILL — IBUPROFEN 800 MG PO TABS: 800 MG | ORAL | Qty: 1

## 2014-05-13 NOTE — Other (Signed)
Assisted patient up to bathroom.  Tolerated well.  Assisted her back into bed.  IV discontinued.  Abdominal dressing removed; incision intact, staples intact.  No redness or drainage noted.  Abdominal binder on.

## 2014-05-13 NOTE — Progress Notes (Signed)
Subjective:no co's     Postpartum Day 1: Cesarean Delivery        Objective:    VITALS:  BP 116/73 mmHg   Pulse 62   Temp(Src) 97.7 ??F (36.5 ??C) (Oral)   Resp 16   Ht 5\' 7"  (1.702 m)   Wt 189 lb (85.73 kg)   BMI 29.59 kg/m2   SpO2 98%   LMP 12/04/2013   Breastfeeding? Unknown    Filed Vitals:    05/13/14 0000   BP: 116/73   Pulse: 62   Temp: 97.7 ??F (36.5 ??C)   Resp: 16         General:    alert, appears stated age and cooperative   Bowel Sounds:  active           Incision:  healing well, no significant drainage, no dehiscence, no significant erythema         DATA: CBC   Lab Results   Component Value Date    WBC 11.3* 05/11/2014    HGB 11.3* 05/11/2014    HCT 33.6* 05/11/2014    PLT 176 05/11/2014        Assessment:     Status post Cesarean section. Doing well postoperatively.     Plan:     Continue current care.      Naw Lasala C. Alga Southall

## 2014-05-13 NOTE — Plan of Care (Signed)
Problem: Discharge Planning:  Goal: Discharged to appropriate level of care  Discharged to appropriate level of care   Outcome: Ongoing  Not going home today    Problem: Fluid Volume - Imbalance:  Goal: Absence of postpartum hemorrhage signs and symptoms  Absence of postpartum hemorrhage signs and symptoms   Outcome: Ongoing  Small vaginal bleed    Problem: Infection - Surgical Site:  Goal: Will show no infection signs and symptoms  Will show no infection signs and symptoms   Outcome: Ongoing  Vitals wnl,abd. Incision clean,dry and intact    Problem: Mood - Altered:  Goal: Mood stable  Mood stable   Outcome: Ongoing  Pleasant,goes to scn to visit infant    Problem: Nausea/Vomiting:  Goal: Absence of nausea/vomiting  Absence of nausea/vomiting   Outcome: Ongoing  none    Problem: Pain - Acute:  Goal: Pain level will decrease  Pain level will decrease   Outcome: Not Met This Shift  Patient states pain controled with pain meds    Problem: Urinary Retention:  Goal: Urinary elimination within specified parameters  Urinary elimination within specified parameters   Outcome: Ongoing  Voiding well on her own    Problem: Venous Thromboembolism:  Goal: Will show no signs or symptoms of venous thromboembolism  Will show no signs or symptoms of venous thromboembolism   Outcome: Ongoing  Homans negative  Goal: Absence of signs or symptoms of impaired coagulation  Absence of signs or symptoms of impaired coagulation   Outcome: Ongoing  Voices no verbal complaint of calf pain    Comments:   Care plan reviewed with patient .  Patient  verbalize understanding of the plan of care and contribute to goal setting.

## 2014-05-14 LAB — CBC WITH AUTO DIFFERENTIAL
Basophils Absolute: 0 10*3/uL (ref 0.0–0.1)
Basophils: 0.4 %
Eosinophils Absolute: 0.2 10*3/uL (ref 0.0–0.4)
Eosinophils: 1.8 %
Hematocrit: 30.4 % — ABNORMAL LOW (ref 37.0–47.0)
Hemoglobin: 10.4 gm/dl — ABNORMAL LOW (ref 12.0–16.0)
Lymphocytes Absolute: 1.8 10*3/uL (ref 1.0–4.8)
Lymphocytes: 17.3 %
MCH: 33.5 pg — ABNORMAL HIGH (ref 27.0–31.0)
MCHC: 34.4 gm/dl (ref 33.0–37.0)
MCV: 97.4 fL (ref 81.0–99.0)
MPV: 9.8 mcm (ref 7.4–10.4)
Monocytes %: 9.6 %
Monocytes Absolute: 1 10*3/uL (ref 0.4–1.3)
Neutrophils Absolute: 7.4 10*3/uL (ref 1.8–7.7)
Platelets: 147 10*3/uL (ref 130–400)
RBC Morphology: NORMAL
RBC: 3.12 10*6/uL — ABNORMAL LOW (ref 4.20–5.40)
RDW: 13.6 % (ref 11.5–14.5)
Seg Neutrophils: 70.9 %
WBC: 10.4 10*3/uL (ref 4.8–10.8)
nRBC: 0 /100 wbc

## 2014-05-14 LAB — CULTURE, STREP B SCREEN, VAGINAL/RECTAL

## 2014-05-14 MED FILL — OXYTOCIN 30 UNITS IN 500 ML INFUSION: 30 UNIT/500ML | INTRAVENOUS | Qty: 500

## 2014-05-14 MED FILL — ONDANSETRON 4 MG PO TBDP: 4 MG | ORAL | Qty: 1

## 2014-05-14 MED FILL — IBUPROFEN 800 MG PO TABS: 800 MG | ORAL | Qty: 1

## 2014-05-14 MED FILL — DOCUSATE SODIUM 100 MG PO CAPS: 100 MG | ORAL | Qty: 1

## 2014-05-14 MED FILL — OXYCODONE-ACETAMINOPHEN 5-325 MG PO TABS: 5-325 MG | ORAL | Qty: 1

## 2014-05-14 MED FILL — OXYCODONE-ACETAMINOPHEN 5-325 MG PO TABS: 5-325 MG | ORAL | Qty: 2

## 2014-05-14 NOTE — Other (Signed)
Ambulating to SCN with family to see babies .

## 2014-05-14 NOTE — Plan of Care (Signed)
Problem: Discharge Planning:  Goal: Discharged to appropriate level of care  Discharged to appropriate level of care   Outcome: Ongoing  Discharge not anticipated.    Problem: Fluid Volume - Imbalance:  Goal: Absence of postpartum hemorrhage signs and symptoms  Absence of postpartum hemorrhage signs and symptoms   Outcome: Ongoing  Lochia WNL  Goal: Absence of imbalanced fluid volume signs and symptoms  Absence of imbalanced fluid volume signs and symptoms   Outcome: Ongoing  Drinking well. Voiding QS.    Problem: Infection - Surgical Site:  Goal: Will show no infection signs and symptoms  Will show no infection signs and symptoms   Outcome: Ongoing  Vitals stable. No signs of infection at incision site.    Problem: Mood - Altered:  Goal: Mood stable  Mood stable   Outcome: Ongoing  Patient pleasant.    Problem: Nausea/Vomiting:  Goal: Absence of nausea/vomiting  Absence of nausea/vomiting   Outcome: Completed Date Met:  05/14/14  Denies nausea.    Problem: Pain - Acute:  Goal: Pain level will decrease  Pain level will decrease   Outcome: Ongoing  Patient satisfied with pain medication.    Problem: Urinary Retention:  Goal: Urinary elimination within specified parameters  Urinary elimination within specified parameters   Outcome: Completed Date Met:  05/14/14  Voiding QS.    Problem: Venous Thromboembolism:  Goal: Will show no signs or symptoms of venous thromboembolism  Will show no signs or symptoms of venous thromboembolism   Outcome: Ongoing  Negative homans.  Goal: Absence of signs or symptoms of impaired coagulation  Absence of signs or symptoms of impaired coagulation   Outcome: Completed Date Met:  05/14/14  Negative homans. Up ad lib.    Comments:   Care plan reviewed with patient.  Patient verbalize understanding of the plan of care and contribute to goal setting.

## 2014-05-14 NOTE — Plan of Care (Signed)
Problem: Discharge Planning:  Goal: Discharged to appropriate level of care  Discharged to appropriate level of care   Outcome: Ongoing  [name] made aware of discharge home tomorrow     Problem: Fluid Volume - Imbalance:  Goal: Absence of postpartum hemorrhage signs and symptoms  Absence of postpartum hemorrhage signs and symptoms   Outcome: Ongoing  No signs of postpartum hemorrhage vital signs stable   Goal: Absence of imbalanced fluid volume signs and symptoms  Absence of imbalanced fluid volume signs and symptoms   Outcome: Ongoing  Taking oral fluids well     Problem: Infection - Surgical Site:  Goal: Will show no infection signs and symptoms  Will show no infection signs and symptoms   Outcome: Ongoing  Afebrile     Problem: Mood - Altered:  Goal: Mood stable  Mood stable   Outcome: Ongoing  Calm cooperative     Problem: Pain - Acute:  Goal: Pain level will decrease  Pain level will decrease   Outcome: Ongoing  Taking oral pain meds with releif of discomforts     Comments:   Care plan reviewed with patient .  Patient  verbalize understanding of the plan of care and contribute to goal setting.

## 2014-05-14 NOTE — Plan of Care (Signed)
Problem: Discharge Planning:  Goal: Discharged to appropriate level of care  Discharged to appropriate level of care   Outcome: Ongoing  Nurse and patient working toward discharge. Ducks in a row     Problem: Fluid Volume - Imbalance:  Goal: Absence of postpartum hemorrhage signs and symptoms  Absence of postpartum hemorrhage signs and symptoms   Outcome: Ongoing  See flowsheet   Goal: Absence of imbalanced fluid volume signs and symptoms  Absence of imbalanced fluid volume signs and symptoms   Outcome: Ongoing  Intake and output QS    Problem: Infection - Surgical Site:  Goal: Will show no infection signs and symptoms  Will show no infection signs and symptoms   Outcome: Ongoing  No S/S of infection noted     Problem: Mood - Altered:  Goal: Mood stable  Mood stable   Outcome: Ongoing  Calm and cooperative     Problem: Pain - Acute:  Goal: Pain level will decrease  Pain level will decrease   Outcome: Ongoing  Pain is managed with motrin and percocet     Problem: Venous Thromboembolism:  Goal: Will show no signs or symptoms of venous thromboembolism  Will show no signs or symptoms of venous thromboembolism   Outcome: Ongoing  Homans sign negative     Comments:   Care plan reviewed with patient.  Patient verbalize understanding of the plan of care and contribute to goal setting.

## 2014-05-15 MED ADMIN — azithromycin (ZITHROMAX) tablet 2,000 mg: 2000 mg | ORAL | @ 19:00:00 | NDC 00781149668

## 2014-05-15 MED ADMIN — cefTRIAXone (ROCEPHIN) 1 g in lidocaine 1 % 2.86 mL IM Injection: 1 g | INTRAMUSCULAR | @ 19:00:00 | NDC 00781320885

## 2014-05-15 MED FILL — OXYCODONE-ACETAMINOPHEN 5-325 MG PO TABS: 5-325 MG | ORAL | Qty: 2

## 2014-05-15 MED FILL — ONDANSETRON 4 MG PO TBDP: 4 MG | ORAL | Qty: 1

## 2014-05-15 MED FILL — IBUPROFEN 800 MG PO TABS: 800 MG | ORAL | Qty: 1

## 2014-05-15 MED FILL — DOCUSATE SODIUM 100 MG PO CAPS: 100 MG | ORAL | Qty: 1

## 2014-05-15 MED FILL — CEFTRIAXONE SODIUM 1 G IJ SOLR: 1 g | INTRAMUSCULAR | Qty: 1

## 2014-05-15 MED FILL — BISAC-EVAC 10 MG RE SUPP: 10 MG | RECTAL | Qty: 1

## 2014-05-15 MED FILL — AZITHROMYCIN 250 MG PO TABS: 250 MG | ORAL | Qty: 8

## 2014-05-15 NOTE — Plan of Care (Signed)
Problem: Discharge Planning:  Goal: Discharged to appropriate level of care  Discharged to appropriate level of care   Outcome: Ongoing  Discharge not anticipated,  Pt instructions on tasks to be completed "ducks in a row" before discharge reinforced.    Problem: Fluid Volume - Imbalance:  Goal: Absence of postpartum hemorrhage signs and symptoms  Absence of postpartum hemorrhage signs and symptoms   Outcome: Ongoing  Pt. Lochia within normal limits.    Problem: Infection - Surgical Site:  Goal: Will show no infection signs and symptoms  Will show no infection signs and symptoms   Outcome: Ongoing  Pt is afebrile,  Vitals within normal limits,  Shows no signs or symptoms of infection    Problem: Mood - Altered:  Goal: Mood stable  Mood stable   Outcome: Ongoing  Pt calm and cooperative    Problem: Pain - Acute:  Goal: Pain level will decrease  Pain level will decrease   Outcome: Ongoing  Pt states she is comfortable  Taking pain medications as ordered        Problem: Venous Thromboembolism:  Goal: Will show no signs or symptoms of venous thromboembolism  Will show no signs or symptoms of venous thromboembolism   Outcome: Ongoing  Pt has negative homans    Comments:   Care plan reviewed with patient .  Patient  verbalize understanding of the plan of care and contribute to goal setting.

## 2014-05-15 NOTE — Progress Notes (Addendum)
Department of Obstetrics and Gynecology  Labor and Delivery  Attending Post Partum Progress Note      SUBJECTIVE:  POD 3    OBJECTIVE: DOING WELL.  Informed by achd that she was exposed to GC/Chlam.  Will empirically tx.    Vitals:  BP 131/92 mmHg   Pulse 72   Temp(Src) 98.8 ??F (37.1 ??C) (Oral)   Resp 18   Ht 5\' 7"  (1.702 m)   Wt 189 lb (85.73 kg)   BMI 29.59 kg/m2   SpO2 98%   LMP 12/04/2013   Breastfeeding? Unknown    DATA:    CBC:    Lab Results   Component Value Date    WBC 10.4 05/14/2014    HGB 10.4* 05/14/2014    HCT 30.4* 05/14/2014    MCV 97.4 05/14/2014    PLT 147 05/14/2014       ASSESSMENT & PLAN:  HOME POST OP DAY 4 BASED ON DISCHARGE PLANS FOR BABY.    Alexis Goodell

## 2014-05-15 NOTE — Plan of Care (Signed)
Problem: Fluid Volume - Imbalance:  Goal: Absence of imbalanced fluid volume signs and symptoms  Absence of imbalanced fluid volume signs and symptoms   Outcome: Completed Date Met:  05/15/14    Comments:   Care plan reviewed with patient and she contributes to goal setting and voices understanding of plan of care.

## 2014-05-16 MED ORDER — OXYCODONE-ACETAMINOPHEN 5-325 MG PO TABS
5-325 MG | ORAL_TABLET | ORAL | Status: AC | PRN
Start: 2014-05-16 — End: 2014-05-23

## 2014-05-16 MED ORDER — FERROUS SULFATE 325 (65 FE) MG PO TBEC
325 (65 Fe) MG | ORAL_TABLET | Freq: Every day | ORAL | Status: DC
Start: 2014-05-16 — End: 2014-10-02

## 2014-05-16 MED FILL — DOCUSATE SODIUM 100 MG PO CAPS: 100 MG | ORAL | Qty: 1

## 2014-05-16 MED FILL — OXYCODONE-ACETAMINOPHEN 5-325 MG PO TABS: 5-325 MG | ORAL | Qty: 2

## 2014-05-16 MED FILL — ONDANSETRON 4 MG PO TBDP: 4 MG | ORAL | Qty: 1

## 2014-05-16 MED FILL — ACETAMINOPHEN 325 MG PO TABS: 325 MG | ORAL | Qty: 2

## 2014-05-16 MED FILL — IBUPROFEN 800 MG PO TABS: 800 MG | ORAL | Qty: 1

## 2014-05-16 MED FILL — BOOSTRIX 5-2.5-18.5 IM SUSP: INTRAMUSCULAR | Qty: 0.5

## 2014-05-16 NOTE — Other (Signed)
Pt A+, rhogam is not indicated,  Discharge instructions given to pt,  Script given,  Pt voices understanding of discharge instructions,Postpartum education brochure given, teaching complete. [place] postpartum depression screening discussed with patient, instructed to contact her healthcare provider if her score is > 12. Patient voiced understanding.

## 2014-05-16 NOTE — Plan of Care (Signed)
Problem: Discharge Planning:  Goal: Discharged to appropriate level of care  Discharged to appropriate level of care   Outcome: Ongoing  Discussed ducks in a row for discharge tomorrow. Infants in SCN    Problem: Fluid Volume - Imbalance:  Goal: Absence of postpartum hemorrhage signs and symptoms  Absence of postpartum hemorrhage signs and symptoms   Outcome: Ongoing  Bleeding WNL    Problem: Infection - Surgical Site:  Goal: Will show no infection signs and symptoms  Will show no infection signs and symptoms   Outcome: Ongoing  See flowsheet.    Problem: Mood - Altered:  Goal: Mood stable  Mood stable   Outcome: Ongoing  Calm and cooperative    Problem: Pain - Acute:  Goal: Pain level will decrease  Pain level will decrease   Outcome: Ongoing  States pain meds are helpful when needed    Problem: Venous Thromboembolism:  Goal: Will show no signs or symptoms of venous thromboembolism  Will show no signs or symptoms of venous thromboembolism   Outcome: Ongoing  Negative homans    Comments:   Care plan reviewed with patient.  Patient  verbalizes understanding of the plan of care and contribute to goal setting.

## 2014-05-16 NOTE — Discharge Summary (Signed)
Obstetrical Discharge Form  Admitting Diagnosis  IUP  OB History    Gravida Para Term Preterm AB TAB SAB Ectopic Multiple Living    3 3 2 1  0 0 0 0 1 4          Reasons for Admission on 05/12/2014 12:42 AM  Delivery of twins, both live [V27.2]  No comment available      Intrapartum Procedures        Multiple birth?: yes                 Postpartum/Operative Complications  Complications: Preterm labor (twins, CAN x 2 twin A)    Newborn Data  Information for the patient's newborn:  Jailynne, Opperman [[id number]]   female  Birth Weight: 2 lb 13.2 oz (1.28 kg)  Information for the patient's newborn:  Amazing, Cowman [[id number]]   female  Birth Weight: 3 lb 8.6 oz (1.605 kg)      Discharge Diagnosis  Surgical Procedures: Cesarean Section    Discharge Information  Current Discharge Medication List      START taking these medications    Details   ferrous sulfate (FE TABS) 325 (65 FE) MG EC tablet Take 1 tablet by mouth daily  Qty: 30 tablet, Refills: 1      oxyCODONE-acetaminophen (PERCOCET) 5-325 MG per tablet Take 1-2 tablets by mouth every 4 hours as needed for Pain  Qty: 30 tablet, Refills: 0         CONTINUE these medications which have NOT CHANGED    Details   acetaminophen (TYLENOL) 325 MG tablet Take 650 mg by mouth every 6 hours as needed.        prenatal vitamin (PRENATAL-S) 27-0.8 MG TABS Take 1 tablet by mouth daily.        ondansetron (ZOFRAN-ODT) 4 MG disintegrating tablet Take 1 tablet by mouth every 8 hours as needed for Nausea or Vomiting.  Qty: 10 tablet, Refills: 0             No discharge procedures on file.    Discharge to:  home  Follow up in 1-2 wks

## 2014-05-16 NOTE — Anesthesia Post-Procedure Evaluation (Signed)
[facility]  POST-ANESTHESIA NOTE       Name:  Jenny Ramirez                                         Age:  28 y.o.  MRN:  [id number]      Last Vitals:  BP 142/82 mmHg   Pulse 60   Temp(Src) 98 ??F (36.7 ??C) (Oral)   Resp 18   Ht 5\' 7"  (1.702 m)   Wt 189 lb (85.73 kg)   BMI 29.59 kg/m2   SpO2 98%   LMP 12/04/2013   Breastfeeding? Unknown  No data found.      Level of Consciousness:  Awake    Respiratory:  Stable    Oxygen Saturation:  Stable    Cardiovascular:  Stable    Hydration:  Adequate    PONV:  Stable    Post-op Pain:  Adequate analgesia    Post-op Assessment:  No apparent anesthetic complications    Additional Follow-Up / Treatment / Comment:  None    Helene Kelp, CRNA  May 16, 2014   8:56 AM

## 2014-05-16 NOTE — Plan of Care (Signed)
Problem: Discharge Planning:  Goal: Discharged to appropriate level of care  Discharged to appropriate level of care   Outcome: Ongoing  Discharge anticipated for today,  Pt states understanding of discharge instructions.        Problem: Fluid Volume - Imbalance:  Goal: Absence of postpartum hemorrhage signs and symptoms  Absence of postpartum hemorrhage signs and symptoms   Outcome: Ongoing  Pt. Lochia within normal limits.    Problem: Infection - Surgical Site:  Goal: Will show no infection signs and symptoms  Will show no infection signs and symptoms   Outcome: Ongoing  Pt is afebrile,  Vitals within normal limits,  Shows no signs or symptoms of infection    Problem: Mood - Altered:  Goal: Mood stable  Mood stable   Outcome: Ongoing  Pt calm and cooperative    Problem: Pain - Acute:  Goal: Pain level will decrease  Pain level will decrease   Outcome: Ongoing  Pt states she is comfortable  Taking pain medications as ordered        Problem: Venous Thromboembolism:  Goal: Will show no signs or symptoms of venous thromboembolism  Will show no signs or symptoms of venous thromboembolism   Outcome: Ongoing  Pt has negative homans    Comments:   Care plan reviewed with patient .  Patient  verbalize understanding of the plan of care and contribute to goal setting.

## 2014-10-02 ENCOUNTER — Inpatient Hospital Stay: Admit: 2014-10-02 | Discharge: 2014-10-02 | Disposition: A | Discharge status: Home / Self Care

## 2014-10-02 DIAGNOSIS — Z202 Contact with and (suspected) exposure to infections with a predominantly sexual mode of transmission: Secondary | ICD-10-CM

## 2014-10-02 LAB — TRICHOMONAS SCREEN: Trichomonas Prep: NEGATIVE

## 2014-10-02 MED ORDER — CEFTRIAXONE SODIUM 250 MG IJ SOLR
250 MG | Freq: Once | INTRAMUSCULAR | Status: AC
Start: 2014-10-02 — End: 2014-10-02
  Administered 2014-10-02: 16:00:00 250 mg via INTRAMUSCULAR

## 2014-10-02 MED ORDER — LIDOCAINE HCL (PF) 1 % IJ SOLN
1 % | INTRAMUSCULAR | Status: DC
Start: 2014-10-02 — End: 2014-10-02

## 2014-10-02 MED ORDER — AZITHROMYCIN 250 MG PO TABS
250 MG | Freq: Every day | ORAL | Status: DC
Start: 2014-10-02 — End: 2014-10-02
  Administered 2014-10-02: 16:00:00 1000 mg via ORAL

## 2014-10-02 MED FILL — CEFTRIAXONE SODIUM 250 MG IJ SOLR: 250 MG | INTRAMUSCULAR | Qty: 250

## 2014-10-02 MED FILL — AZITHROMYCIN 250 MG PO TABS: 250 MG | ORAL | Qty: 4

## 2014-10-02 MED FILL — XYLOCAINE-MPF 1 % IJ SOLN: 1 % | INTRAMUSCULAR | Qty: 2

## 2014-10-02 NOTE — ED Provider Notes (Signed)
[facility] encounter                                          CHIEF COMPLAINT       Chief Complaint   Patient presents with   ??? Vaginal Discharge     Possible exposure to gonorrhea       Nurses Notes reviewed and I agree except as noted in the HPI.      HISTORY OF PRESENT ILLNESS    Jenny Ramirez is a 28 y.o. female who presents with concern for vaginal discharge and possible gonorrhea.     REVIEW OF SYSTEMS     Review of Systems   Constitutional: Negative for fever, diaphoresis, appetite change and fatigue.   HENT: Negative for congestion, sore throat and tinnitus.    Eyes: Negative for visual disturbance.   Respiratory: Negative for cough, shortness of breath and wheezing.    Cardiovascular: Negative for chest pain and leg swelling.   Gastrointestinal: Negative for nausea, abdominal pain, diarrhea and constipation.   Genitourinary: Positive for vaginal discharge, pelvic pain and dyspareunia. Negative for frequency, vaginal bleeding and menstrual problem.   Musculoskeletal: Negative for myalgias, arthralgias and neck stiffness.   Skin: Negative for rash.   Neurological: Negative for dizziness, weakness and headaches.   Psychiatric/Behavioral: The patient is not nervous/anxious.    All other systems reviewed and are negative.       PAST MEDICAL HISTORY   Patient  has a past medical history of Abnormal Pap smear; Gonorrhea; Chlamydia; and Kidney stones.    SURGICAL HISTORY     Patient  has past surgical history that includes knee surgery (2009); Wisdom tooth extraction (2004); and fracture surgery.    CURRENT MEDICATIONS       Discharge Medication List as of 10/02/2014 10:46 AM      CONTINUE these medications which have NOT CHANGED    Details   NONFORMULARY bcp - monolinyah             ALLERGIES     Patient has No Known Allergies.    FAMILY HISTORY     Patient indicated that her mother is alive. She indicated that her father is alive.  family history is not on  file.    SOCIAL HISTORY     Patient  reports that she has been smoking Cigarettes.  She has a 5 pack-year smoking history. She does not have any smokeless tobacco history on file. She reports that she does not drink alcohol or use illicit drugs.    PHYSICAL EXAM     INITIAL VITALS:  weight is 184 lb 9.6 oz (83.734 kg). Her temperature is 98.4 ??F (36.9 ??C). Her blood pressure is 132/70 and her pulse is 84. Her respiration is 18 and oxygen saturation is 100%.      Physical Exam   Constitutional: She is oriented to person, place, and time. She appears well-developed and well-nourished. No distress.   Eyes: Conjunctivae are normal. Right eye exhibits no discharge. Left eye exhibits no discharge.   Cardiovascular: Normal rate.    Pulmonary/Chest: Effort normal. No respiratory distress.   Abdominal: Hernia confirmed negative in the right inguinal area and confirmed negative in the left inguinal area.   Genitourinary: There is no rash, tenderness, lesion or injury on the right labia. There is no rash, tenderness, lesion or injury  on the left labia. Cervix exhibits discharge. Cervix exhibits no motion tenderness and no friability. Right adnexum displays tenderness. Right adnexum displays no mass and no fullness. Left adnexum displays tenderness. Left adnexum displays no mass and no fullness. There is tenderness and bleeding in the vagina. No erythema in the vagina. No signs of injury around the vagina. Vaginal discharge found.       Musculoskeletal: Normal range of motion. She exhibits no edema or tenderness.   Lymphadenopathy:        Right: No inguinal adenopathy present.        Left: No inguinal adenopathy present.   Neurological: She is alert and oriented to person, place, and time.   Skin: Skin is warm and dry. No rash noted. She is not diaphoretic. No erythema. No pallor.   Psychiatric: She has a normal mood and affect. Her behavior is normal. Judgment and thought content normal.   Nursing note and vitals  reviewed.      DIAGNOSTIC RESULTS     LABS:   Results for orders placed during the hospital encounter of 10/02/14   TRICHOMONAS SCREEN       Result Value Ref Range    Trichomonas Prep NEGATIVE  NEGATIVE       Imaging    URGENT CARE COURSE:   Vitals:    Filed Vitals:    10/02/14 0958   BP: 132/70   Pulse: 84   Temp: 98.4 ??F (36.9 ??C)   Resp: 18   Weight: 184 lb 9.6 oz (83.734 kg)   SpO2: 100%     Patient who just delivered a little boy 4 months ago presents with concern for 7 day white/green-yellow vaginal discharge and mild pelvic pain.  Patient states she has been sexually active with the same female partner for the long-term but she was infected with gonorrhea a couple of times in the past several years from him.  She states the symptoms are similar today.  She is requesting as much testing as we can do.  Denies fever or constitutional symptoms of infection.  She has some dyspareunia and noticed recently a little bit of bleeding postcoital.    [facility].  The cervix was visualized and noticed to be open approximately 5 mm and a pink cervical polyp was noted at about the 3:00 position with slight bleeding.  There was thick white vaginal discharge, no excoriation or erythema of the introitus.  Patient had no cervical motion tenderness but did display adnexal discomfort bilaterally.  Patient tolerated the exam well.    Trichomonas screen negative.  Patient treated empirically for GC and chlamydia with cultures sent.  Medications   azithromycin (ZITHROMAX) tablet 1,000 mg (1,000 mg Oral Given 10/02/14 1036)   lidocaine 1 % (PF) injection (not administered)   cefTRIAXone (ROCEPHIN) injection 250 mg (250 mg Intramuscular Given 10/02/14 1037)       PROCEDURES:  None    FINAL IMPRESSION      Problem List Items Addressed This Visit     None      Visit Diagnoses     Possible exposure to STD    -  Primary             DISPOSITION/PLAN   Decision To Discharge.  [facility].    PATIENT REFERRED TO:  [facility]  [address]  [place] [id number]  [phone]            DISCHARGE MEDICATIONS:  Discharge Medication List as of 10/02/2014 10:46 AM            Melvern Sampleimothy Jeran Hiltz, NP      Melvern Sampleimothy Mervil Wacker, NP  10/02/14 1124

## 2014-10-02 NOTE — ED Notes (Signed)
Pt. Released in stable condition, ambulated per self to private car. Instructed pt to follow-up with family doctor as needed for recheck or go directly to the emergency department for any concerns/worsening conditions. Pt. Verbalized understanding of instructions. No questions at this time. RX sent      Pascal LuxJami Claron Rosencrans, RN  10/02/14 1101

## 2014-10-02 NOTE — ED Notes (Signed)
Pt presents with c/o yellow/white vaginal discharge, "sharp" pain in vagina off and on. Pt has a personal history of gonorrhea and chlamydia. Possible exposure to gonorrhea. Sx. Started 7 days ago.     Pascal LuxJami Eulala Newcombe, RN  10/02/14 1002

## 2014-10-06 LAB — CHLAMYDIA CULTURE

## 2014-10-06 LAB — CULTURE, GENITAL

## 2014-12-11 ENCOUNTER — Inpatient Hospital Stay: Admit: 2014-12-11 | Discharge: 2014-12-11 | Disposition: A | Discharge status: Home / Self Care

## 2014-12-11 DIAGNOSIS — Z202 Contact with and (suspected) exposure to infections with a predominantly sexual mode of transmission: Secondary | ICD-10-CM

## 2014-12-11 MED ORDER — LIDOCAINE HCL (PF) 1 % IJ SOLN
1 % | INTRAMUSCULAR | Status: AC
Start: 2014-12-11 — End: 2014-12-11
  Administered 2014-12-11: 16:00:00 2

## 2014-12-11 MED ORDER — LIDOCAINE HCL 1 % IJ SOLN
1 % | Freq: Once | INTRAMUSCULAR | Status: AC
Start: 2014-12-11 — End: 2014-12-11
  Administered 2014-12-11: 16:00:00 250 mg via INTRAMUSCULAR

## 2014-12-11 MED ORDER — AZITHROMYCIN 250 MG PO TABS
250 MG | Freq: Once | ORAL | Status: AC
Start: 2014-12-11 — End: 2014-12-11
  Administered 2014-12-11: 16:00:00 1000 mg via ORAL

## 2014-12-11 MED ORDER — CEFTRIAXONE SODIUM 250 MG IJ SOLR
250 MG | INTRAMUSCULAR | Status: DC
Start: 2014-12-11 — End: 2014-12-11

## 2014-12-11 MED FILL — XYLOCAINE-MPF 1 % IJ SOLN: 1 % | INTRAMUSCULAR | Qty: 2

## 2014-12-11 MED FILL — CEFTRIAXONE SODIUM 250 MG IJ SOLR: 250 MG | INTRAMUSCULAR | Qty: 250

## 2014-12-11 MED FILL — AZITHROMYCIN 250 MG PO TABS: 250 MG | ORAL | Qty: 4

## 2014-12-11 NOTE — ED Provider Notes (Addendum)
[facility]  Urgent Care Encounter      CHIEF COMPLAINT       Chief Complaint   Patient presents with   ??? Vaginal Discharge     with odor       Nurses Notes reviewed and I agree except as noted in the HPI.  HISTORY OF PRESENT ILLNESS   Jenny Ramirez is a 29 y.o. female who presents to the urgent care with complaints of recent exposure to chlamydia and gonorrhea.  States that she has had this in the past and was treated.  Her partner was notified by her but his treatment is unknown, and they continued sexual activity.  She denies vaginal discharge.  But she does complain of "vaginal discomfort and odor".  She states that she feels like she did before when she had this.  She would like treatment.  Last menstrual period in February 2016.  She has her twin 6043-month-old was with her today.  States that she is not breast-feeding.      REVIEW OF SYSTEMS     Review of Systems   Constitutional: Negative for fever, chills, appetite change and fatigue.   HENT: Negative for congestion, ear pain, postnasal drip, rhinorrhea, sinus pressure, sore throat and trouble swallowing.    Respiratory: Negative for chest tightness and shortness of breath.    Cardiovascular: Negative for chest pain.   Gastrointestinal: Negative for nausea, vomiting, abdominal pain and diarrhea.   Genitourinary: Positive for vaginal pain. Negative for dysuria, vaginal discharge and pelvic pain.        "Vaginal discomfort"   Skin: Negative for rash.   Neurological: Negative for headaches.   Psychiatric/Behavioral: Negative.        PAST MEDICAL HISTORY         Diagnosis Date   ??? Abnormal Pap smear 2010   ??? Gonorrhea 12/04/2013   ??? Chlamydia 12/04/2013   ??? Kidney stones        SURGICAL HISTORY     Patient  has past surgical history that includes knee surgery (2009); Wisdom tooth extraction (2004); fracture surgery; and Cesarean section (2015).    CURRENT MEDICATIONS       Previous Medications    MEDROXYPROGESTERONE (DEPO-PROVERA) 150 MG/ML  INJECTION    Inject 150 mg into the muscle every 3 months    NONFORMULARY    bcp - monolinyah       ALLERGIES     Patient is has No Known Allergies.    FAMILY HISTORY     Patient's family history is not on file.    SOCIAL HISTORY     Patient  reports that she has been smoking Cigarettes.  She has a 5 pack-year smoking history. She does not have any smokeless tobacco history on file. She reports that she does not drink alcohol or use illicit drugs.    PHYSICAL EXAM     ED TRIAGE VITALS  BP: 124/86 mmHg, Temp: 97.5 ??F (36.4 ??C), Pulse: 88, Resp: 16, SpO2: 99 %  Physical Exam   Constitutional: She is oriented to person, place, and time. Vital signs are normal. She appears well-developed and well-nourished. She is cooperative.  Non-toxic appearance. She does not have a sickly appearance. She does not appear ill. No distress.   HENT:   Head: Normocephalic and atraumatic.   Left Ear: Hearing, tympanic membrane, external ear and ear canal normal.   Neck: Normal range of motion and full passive range of motion without pain.  Neck supple. No rigidity.   Cardiovascular: Normal rate.    Pulmonary/Chest: Effort normal and breath sounds normal. No accessory muscle usage. No respiratory distress.   Abdominal: Soft. Normal appearance and bowel sounds are normal. There is no tenderness.   Genitourinary:   Patient declined vaginal exam and opted for urine specimens to be obtained for Chlamydia Trachomatis and Neisseria Gonorrhoeae.  Denies exposure to Trichomonas, to her knowledge, and denies vaginal discharge.     Neurological: She is alert and oriented to person, place, and time.   Skin: Skin is warm and dry. She is not diaphoretic.   Psychiatric: She has a normal mood and affect. Her speech is normal.   Nursing note and vitals reviewed.      DIAGNOSTIC RESULTS   Labs:No results found for this visit on 12/11/14.    IMAGING:    URGENT CARE COURSE:     Filed Vitals:    12/11/14 1026 12/11/14 1028   BP:  124/86   Pulse:  88   Temp:  97.5 ??F (36.4 ??C)    TempSrc: Temporal    Resp:  16   SpO2:  99%       Medications   azithromycin (ZITHROMAX) tablet 1,000 mg (not administered)   cefTRIAXone (ROCEPHIN) 250 MG injection (not administered)   cefTRIAXone (ROCEPHIN) 250 mg in lidocaine 1 % 0.71 mL IM Injection (250 mg Intramuscular Given 12/11/14 1106)   lidocaine PF 1 % (PF) injection (2 mLs  Given 12/11/14 1106)         PROCEDURES:  None  FINAL IMPRESSION      1. STD exposure        DISPOSITION/PLAN   DISPOSITION     Follow-up family care provider as indicated.    ?? Do not have sexual contact while you have symptoms of an STI or are being treated for an STI.  ?? Tell your sex partner (or partners) that he or she will need treatment.  ?? If you are a woman, do not douche. Douching changes the normal balance of bacteria in the vagina and may spread an infection up into your reproductive organs.            PATIENT REFERRED TO:  [facility]  [address]  [place] [address]  [phone]  In 2 days  If symptoms worsen, GO DIRECTLY TO THE EMERGENCY DEPARTMENT    DISCHARGE MEDICATIONS:  New Prescriptions    No medications on file     Current Discharge Medication List          Wynonia Sours, CNP          Wynonia Sours, CNP  12/11/14 [address] CNP  12/11/14 1113

## 2014-12-11 NOTE — ED Notes (Signed)
Reviewed discharge instructions with patient, including medications given, safe sex practices and homecare of conditions, as well as follow-up appointments as necessary; told [facility] if she has not received a call from us by this Friday to obtain results of send-out cultures. Pt. Verbalized understanding and had no further questions/concerns at time of discharge. Ambulatory to the lobby in no acute distress.       Lovena LeAudrey H Yania Bogie, RN  12/11/14 1128

## 2014-12-11 NOTE — ED Notes (Signed)
Pt. Given injection of rocephin with lidocaine as per order in right gluteus; she tolerated well. Informed that discharge instructions would be given shortly.     Lovena LeAudrey H Mickie Kozikowski, RN  12/11/14 1119
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
## 2014-12-12 MED FILL — LIDOCAINE HCL 1 % IJ SOLN: 1 % | INTRAMUSCULAR | Qty: 20

## 2014-12-13 LAB — NEISSERIA GONORRHOEAE, NAA: GONORRHOEAE URINE PROBE: POSITIVE — AB

## 2014-12-13 LAB — CHLAMYDIA TRACHOMATIS, NAA: C. trachomatis DNA ,Urine: POSITIVE — AB

## 2015-08-05 ENCOUNTER — Encounter (HOSPITAL_BASED_OUTPATIENT_CLINIC_OR_DEPARTMENT_OTHER): Payer: Self-pay | Admitting: Emergency Medicine

## 2015-08-05 ENCOUNTER — Emergency Department (HOSPITAL_BASED_OUTPATIENT_CLINIC_OR_DEPARTMENT_OTHER)
Admission: EM | Admit: 2015-08-05 | Discharge: 2015-08-05 | Disposition: A | Discharge status: Home / Self Care | Payer: Self-pay | Attending: Emergency Medicine | Admitting: Emergency Medicine

## 2015-08-05 DIAGNOSIS — Z87891 Personal history of nicotine dependence: Secondary | ICD-10-CM | POA: Insufficient documentation

## 2015-08-05 DIAGNOSIS — R234 Changes in skin texture: Secondary | ICD-10-CM | POA: Insufficient documentation

## 2015-08-05 MED ORDER — CEPHALEXIN 250 MG PO CAPS: 250.0000 mg | ORAL_CAPSULE | Freq: Four times a day (QID) | ORAL | Status: DC

## 2015-08-05 NOTE — Discharge Instructions (Signed)
1. Medications: keflex, usual home medications 2. Treatment: rest, drink plenty of fluids 3. Follow Up: please followup with your primary doctor for discussion of your diagnoses and further evaluation after today's visit; if you do not have a primary care doctor use the resource guide provided to find one; please return to the ER for fever, abdominal pain, vomiting, increased pain or swelling, new or worsening symptoms   Emergency Department Resource Guide 1) Find a Doctor and Pay Out of Pocket Although you won't have to find out who is covered by your insurance plan, it is a good idea to ask around and get recommendations. You will then need to call the office and see if the doctor you have chosen will accept you as a new patient and what types of options they offer for patients who are self-pay. Some doctors offer discounts or will set up payment plans for their patients who do not have insurance, but you will need to ask so you aren't surprised when you get to your appointment.  2) Contact Your Local Health Department Not all health departments have doctors that can see patients for sick visits, but many do, so it is worth a call to see if yours does. If you don't know where your local health department is, you can check in your phone book. The CDC also has a tool to help you locate your state's health department, and many state websites also have listings of all of their local health departments.  3) Find a Walk-in Clinic If your illness is not likely to be very severe or complicated, you may want to try a walk in clinic. These are popping up all over the country in pharmacies, drugstores, and shopping centers. They're usually staffed by nurse practitioners or physician assistants that have been trained to treat common illnesses and complaints. They're usually fairly quick and inexpensive. However, if you have serious medical issues or chronic medical problems, these are probably not your best  option.  No Primary Care Doctor: - Call Health Connect at  [phone] - they can help you locate a primary care doctor that  accepts your insurance, provides certain services, etc. - Physician Referral Service- [phone]  Chronic Pain Problems: Organization         Address  Phone   Notes  [facility]  [phone]) [phone] Patients need to be referred by their primary care doctor.   Medication Assistance: Organization         Address  Phone   Notes  [facility] Medication [facility] Health[facility] [address] [place], [place] [address] [phone] --Must be a resident of [facility] [place] [facility] [place] -- Must have NO insurance coverage whatsoever (no Medicaid/ Medicare, etc.) -- The pt. MUST have a primary care doctor that directs their care regularly and follows them in the community   MedAssist  [phone]   [employer]  [phone]) [phone]    Agencies that provide inexpensive medical care: Organization         Address  Phone   Notes  [facility]  [phone]) [phone]   [facility]    [phone]   [facility] [address] [place], [place] [address] [phone]   [facility] [address], [place] [phone]) [phone]   [facility]anned Parenthood    [phone]   [facility]    [phone](336) [phone]   [facility]  [address], [place] Phone:  [phone], Fax:  [phone] Hours of Operation:  9 am - 6 pm, M-F.  Also accepts Medicaid/Medicare and self-pay.  [facility] for [place], [address], [place] Phone: [phone], Fax: [phone]. Hours of Operation:  8:30 am - 5:30 pm, M-F.  Also accepts Medicaid and self-pay.  [facility] [place] [address], [place] Phone: [phone]   [place], [place], [place] [phone], Ext. [phone] Mondays & Thursdays: 7-9 AM.  First 15  patients are seen on a first come, first serve basis.    [place] Providers:  Organization         Address  Phone   Notes  [facility] [address], [place] [phone] Also accepts self-pay patients.  [facility] [address], [place]  [phone]   [place], [address], [place] [phone]   [facility] [address] [place] [phone]   Lucianne Lei [address], [place]   [phone] Only accepts [place] Access [place] patients after they have their name applied to their card.   Self-Pay (no insurance) in [facility]:  Organization         Address  Phone   Notes  Sickle Cell Patients, [facility] [place] [phone]   [facility] [place] [phone]   [facility] [place]  [place], [place], [place] [phone]   [facility]/Dr. Osei-Bonsu  [address], [place] [address], [place] [phone] Phone number for both [place] and [place] locations is the same.  Urgent Medical and [facility] [address] [place] [phone]   [facility] [address], [place] or [address] [phone]   [facility] [address] [place] [phone], phone; [phone], fax Sees patients 1st and 3rd Saturday of every month.  Must not qualify for public or private insurance (i.e. Medicaid, Medicare, [place] Health Choice, Veterans' Benefits)  Household income should be no more than 200% of the poverty level The clinic cannot treat you if you are pregnant or think you are pregnant  Sexually transmitted diseases are not treated at the clinic.    Dental  Care: Organization         Address  Phone  Notes  [facility] Department of [facility] [place] [phone] Accepts children up to age 61 who are enrolled in [place] or [employer] [place]; pregnant women with a Medicaid card; and children who have applied for Medicaid or [place] Health Choice, but were declined, whose parents can pay a reduced fee at time of service.  [facility] Department of [facility]  [address], [place] [phone] Accepts children up to age 47 who are enrolled in [place] or [place]; pregnant women with a Medicaid card; and children who have applied for Medicaid or [place] Health Choice, but were declined, whose parents can pay a reduced fee at time of service.  [facility] Adult Dental Access PROGRAM  [place], [place] [phone]  Patients are seen by appointment only. Walk-ins are not accepted. [place] will see patients 1 years of age and older. Monday - Tuesday (8am-5pm) Most Wednesdays (8:30-5pm) $30 per visit, cash only  [facility] Adult Dental Access PROGRAM  [address], [facility] [phone] Patients are seen by appointment only. Walk-ins are not accepted. [place] will see patients 28 years of age and older. One Wednesday Evening (Monthly: Volunteer Based).  $30 per visit, cash only  [place]  [phone] for adults; Children under age 81, call Graduate Pediatric Dentistry at [phone]. Children aged 82-14, please call [phone] to request a pediatric application.  Dental services are provided in all areas of dental care including fillings, crowns and bridges, complete and partial dentures, implants, gum treatment, root canals, and extractions. Preventive care is also provided. Treatment is provided to both adults and children. Patients are selected via a lottery and there is often a waiting list.   [facility] [address] [place]  [phone] [email or web address]   [facility] [address] [place], [place] [phone], Ext. [phone] Second and Fourth Thursday of each month, opens at 6:30 AM; Clinic ends at 9 AM.  Patients are seen on a first-come first-served basis, and a limited number are seen during each clinic.   [facility]  [address] Hillard Danker [place], [place] [phone]   Eligibility Requirements You must have lived in [place], [place], or [place] counties for at least the last three months.   You cannot be eligible for state or federal sponsored [employer], including [employer], [place], or [employer].   You generally cannot be eligible for healthcare insurance through your employer.    How to apply: Eligibility screenings are held every Tuesday and Wednesday afternoon from 1:00 pm until 4:00 pm. You do not need an appointment for the interview!  [facility] [address] [place], [place]   [place]  [place] Department  [place]  [phone]    Behavioral Health Resources in the Community: Intensive Outpatient Programs Organization         Address  Phone  Notes  [place]. [address], [place], [place] [phone]   [facility] Outpatient [address] [place], [place]   ADS: Alcohol & Drug Svcs [address], [place], [place]   [place] [address],  [place], [place] or [phone]   Substance Abuse Resources Organization         Address  Phone  Notes  Alcohol and Drug Services  [phone]   [place]  [phone]   The [place]   Chinita Pester  [phone]   Residential & Outpatient Substance Abuse Program  [phone]    Psychological Services Organization         Address  Phone  Notes  [facility] [place]  [place]  [phone]   [place] [address], [place] or [phone]    Mobile Crisis Teams Organization         Address  Phone  Notes  [facility]rapeutic Alternatives, Mobile Crisis Care Unit  [phone]   [facility]ive Psychotherapeutic Services  [address] [employer], [place]   [facility] [address] [place]  [place] [phone]    Self-Help/Support Groups Organization         Address  Phone             Notes  Mental Health Assoc. of [place] - variety of support groups  [place] Call for more information  Narcotics Anonymous (NA), Caring Services [address], [employer] [place]  2 meetings at this location   Special educational needs teacher         Address  Phone  Notes  ASAP Residential Treatment [place],    [place]  [phone]   [facility]  [address] [place] [phone], [place], [place]   [place], [place] [phone] Admissions: 8am-3pm M-F  Incentives Substance [place] [address],    [place], [place] [phone]   [facility] [address] [place], [place], [place]   [facility] [address]  [place], [place]   [facility] Programs - Intensive Outpatient [address], [place], [place]   [facility] [place].,  [place], [place] [phone] or [phone]   [facility] (RTS) [address], [place], [place] Accepts Medicaid  [facility] [place] [address]  [place] [phone] Substance Abuse/Addiction Treatment   [facility] Organization         Address  Phone  Notes  [facility]  [phone]   Domenic Schwab, PhD [address] Arlis [place], [place]   [phone] or [phone]   [place], [place] [phone]   [facility] [address], [place], [place] [phone] Insurance/Medicaid/sponsorship through [facility] and Families [address] [place]                                    [place], [place] [phone] [place] [address], [place] [phone]    Dr. Adele Schilder  [phone]   [facility] Dept. 1) [address],  2) [place] 3)  [place] [address], [place] [phone]  [phone]   [place] [phone] or [phone] (After Hours)

## 2015-08-05 NOTE — ED Provider Notes (Signed)
CSN: [id number]     Arrival date & time 08/05/15  1017 History   First MD Initiated Contact with Patient 08/05/15 1022     Chief Complaint  Patient presents with  . Recurrent Skin Infections    HPI   Connie Glenn is a 29 y.o. female with no pertinent PMH who presents to the ED with "bumps" on her right buttock and right groin. She states she noticed the bump on her buttock on Friday, and reports when she was showering this morning, she noticed a bump in her right groin. She denies significant pain, and states she has not noticed any drainage. She has not tried anything for symptom relief. She denies fever, chills, abdominal pain, N/V/D/C, dysuria, urgency, frequency, vaginal discharge.    History reviewed. No pertinent past medical history. History reviewed. No pertinent past surgical history. Family History  Problem Relation Age of Onset  . Sudden death Neg Hx   . Heart attack Neg Hx    Social History  Substance Use Topics  . Smoking status: Former Games developer  . Smokeless tobacco: None  . Alcohol Use: No   OB History    No data available      Review of Systems  Constitutional: Negative for fever and chills.  Gastrointestinal: Negative for nausea, vomiting, abdominal pain, diarrhea and constipation.  Genitourinary: Negative for dysuria, urgency, frequency and vaginal discharge.  Skin:       Reports "bumps" to right buttock and right groin.      Allergies  Review of patient's allergies indicates no known allergies.  Home Medications   Prior to Admission medications   Medication Sig Start Date End Date Taking? Authorizing Provider  cephALEXin (KEFLEX) 250 MG capsule Take 1 capsule (250 mg total) by mouth 4 (four) times daily. 08/05/15   Mady Gemma, PA-C    BP 111/71 mmHg  Pulse 66  Temp(Src) 98.4 F (36.9 C) (Oral)  Resp 18  Ht 6' (1.829 m)  Wt 245 lb (111.131 kg)  BMI 33.22 kg/m2  SpO2 100%  LMP 07/05/2015 Physical Exam  Constitutional: She is  oriented to person, place, and time. She appears well-developed and well-nourished. No distress.  HENT:  Head: Normocephalic and atraumatic.  Right Ear: External ear normal.  Left Ear: External ear normal.  Nose: Nose normal.  Mouth/Throat: Uvula is midline, oropharynx is clear and moist and mucous membranes are normal.  Eyes: Conjunctivae, EOM and lids are normal. Pupils are equal, round, and reactive to light. Right eye exhibits no discharge. Left eye exhibits no discharge. No scleral icterus.  Neck: Normal range of motion. Neck supple.  Cardiovascular: Normal rate, regular rhythm, normal heart sounds, intact distal pulses and normal pulses.   Pulmonary/Chest: Effort normal and breath sounds normal. No respiratory distress. She has no wheezes. She has no rales.  Abdominal: Soft. Normal appearance and bowel sounds are normal. She exhibits no distension and no mass. There is no tenderness. There is no rigidity, no rebound and no guarding.  Genitourinary:     Musculoskeletal: Normal range of motion. She exhibits no edema or tenderness.  Neurological: She is alert and oriented to person, place, and time.  Skin: Skin is warm, dry and intact. No rash noted. She is not diaphoretic. No erythema. No pallor.  Psychiatric: She has a normal mood and affect. Her speech is normal and behavior is normal.  Nursing note and vitals reviewed.   ED Course  Procedures (including critical care time)  Labs Review Labs Reviewed -  No data to display  Imaging Review No results found.     EKG Interpretation None      MDM   Final diagnoses:  Skin induration    29 year old female presents with skin induration to her right buttock and right groin. She denies drainage, fever, chills, abdominal pain, N/V/D/C, dysuria, urgency, frequency, vaginal discharge. Patient is afebrile. Vital signs stable. Heart regular rate and rhythm. Lungs clear to auscultation bilaterally. Abdomen soft, nontender,  nondistended. 0.5 cm area of induration to right labia with no edema, erythema, fluctuance, or drainage. 0.5 cm area of induration to right medial buttock with no edema, erythema, fluctuance, or drainage.   On exam, no drainable abscess, no signs of overlying skin infection. Given induration, will treat with keflex. Patient to follow up with PCP. Strict return precautions discussed. Patient verbalizes her understanding and is in agreement with plan.  BP 111/71 mmHg  Pulse 66  Temp(Src) 98.4 F (36.9 C) (Oral)  Resp 18  Ht 6' (1.829 m)  Wt 245 lb (111.131 kg)  BMI 33.22 kg/m2  SpO2 100%  LMP 07/05/2015      Mady GemmaElizabeth C [place], PA-C 08/05/15 1135  Arby BarretteMarcy Pfeiffer, MD 08/06/15 [phone]

## 2016-08-09 ENCOUNTER — Emergency Department (HOSPITAL_BASED_OUTPATIENT_CLINIC_OR_DEPARTMENT_OTHER)
Admission: EM | Admit: 2016-08-09 | Discharge: 2016-08-09 | Disposition: A | Discharge status: Home / Self Care | Payer: No Typology Code available for payment source | Attending: Emergency Medicine | Admitting: Emergency Medicine

## 2016-08-09 ENCOUNTER — Encounter (HOSPITAL_BASED_OUTPATIENT_CLINIC_OR_DEPARTMENT_OTHER): Payer: Self-pay | Admitting: *Deleted

## 2016-08-09 DIAGNOSIS — Z87891 Personal history of nicotine dependence: Secondary | ICD-10-CM | POA: Diagnosis not present

## 2016-08-09 DIAGNOSIS — S161XXA Strain of muscle, fascia and tendon at neck level, initial encounter: Secondary | ICD-10-CM | POA: Diagnosis not present

## 2016-08-09 DIAGNOSIS — Y9389 Activity, other specified: Secondary | ICD-10-CM | POA: Diagnosis not present

## 2016-08-09 DIAGNOSIS — Y92410 Unspecified street and highway as the place of occurrence of the external cause: Secondary | ICD-10-CM | POA: Diagnosis not present

## 2016-08-09 DIAGNOSIS — Y999 Unspecified external cause status: Secondary | ICD-10-CM | POA: Insufficient documentation

## 2016-08-09 DIAGNOSIS — S199XXA Unspecified injury of neck, initial encounter: Secondary | ICD-10-CM | POA: Diagnosis present

## 2016-08-09 DIAGNOSIS — T148XXA Other injury of unspecified body region, initial encounter: Secondary | ICD-10-CM
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
## 2017-04-04 ENCOUNTER — Emergency Department: Admit: 2017-04-04 | Payer: PRIVATE HEALTH INSURANCE

## 2017-04-04 ENCOUNTER — Inpatient Hospital Stay
Admit: 2017-04-04 | Discharge: 2017-04-04 | Disposition: A | Discharge status: Home / Self Care | Payer: PRIVATE HEALTH INSURANCE | Attending: Family Medicine

## 2017-04-04 DIAGNOSIS — N390 Urinary tract infection, site not specified: Secondary | ICD-10-CM

## 2017-04-04 LAB — UA W/O MICROSCOPIC, REFLEX C & S
Bilirubin Urine: NEGATIVE
Glucose, Urine: NEGATIVE mg/dl
Ketones, Urine: NEGATIVE
Nitrate, UA: NEGATIVE
Specific Gravity, UA: 1.015 (ref 1.002–1.03)
Urobilinogen, Urine: 0.2 eu/dl (ref 0.0–1.0)
pH, UA: 8 (ref 5.0–9.0)

## 2017-04-04 LAB — COMPREHENSIVE METABOLIC PANEL W/ REFLEX TO MG FOR LOW K
ALT: 11 U/L (ref 11–66)
AST: 14 U/L (ref 5–40)
Albumin: 3.9 g/dL (ref 3.5–5.1)
Alkaline Phosphatase: 64 U/L (ref 38–126)
BUN: 9 mg/dL (ref 7–22)
CO2: 21 meq/L — ABNORMAL LOW (ref 23–33)
Calcium: 9.4 mg/dL (ref 8.5–10.5)
Chloride: 104 meq/L (ref 98–111)
Creatinine: 0.6 mg/dL (ref 0.4–1.2)
Glucose: 83 mg/dL (ref 70–108)
Potassium reflex Magnesium: 4.1 meq/L (ref 3.5–5.2)
Sodium: 137 meq/L (ref 135–145)
Total Bilirubin: 0.5 mg/dL (ref 0.3–1.2)
Total Protein: 6.9 g/dL (ref 6.1–8.0)

## 2017-04-04 LAB — CBC WITH AUTO DIFFERENTIAL
Basophils Absolute: 0 10*3/uL (ref 0.0–0.1)
Basophils: 0.4 %
Eosinophils Absolute: 0 10*3/uL (ref 0.0–0.4)
Eosinophils: 0.5 %
Hematocrit: 42.6 % (ref 37.0–47.0)
Hemoglobin: 14.5 gm/dl (ref 12.0–16.0)
Immature Grans (Abs): 0.04 10*3/uL (ref 0.00–0.07)
Immature Granulocytes: 0.5 %
Lymphocytes Absolute: 2 10*3/uL (ref 1.0–4.8)
Lymphocytes: 26 %
MCH: 31.9 pg (ref 26.0–33.0)
MCHC: 34 gm/dl (ref 32.2–35.5)
MCV: 93.8 fL (ref 81.0–99.0)
MPV: 10.8 fL (ref 9.4–12.4)
Monocytes Absolute: 0.7 10*3/uL (ref 0.4–1.3)
Monocytes: 8.8 %
Platelets: 237 10*3/uL (ref 130–400)
RBC: 4.54 10*6/uL (ref 4.20–5.40)
RDW-CV: 12.9 % (ref 11.5–14.5)
RDW-SD: 44.4 fL (ref 35.0–45.0)
Seg Neutrophils: 63.8 %
Segs Absolute: 4.8 10*3/uL (ref 1.8–7.7)
WBC: 7.5 10*3/uL (ref 4.8–10.8)
nRBC: 0 /100 wbc

## 2017-04-04 LAB — KOH (SKIN,HAIR,NAILS)

## 2017-04-04 LAB — C. TRACHOMATIS / N. GONORRHOEAE, DNA
Chlamydia Trachomatis By RT-PCR: NOT DETECTED
Neisseria Gonorrhoeae By RT-PCR: NOT DETECTED

## 2017-04-04 LAB — LACTIC ACID: Lactic Acid: 0.5 mmol/L (ref 0.5–2.2)

## 2017-04-04 LAB — LIPASE: Lipase: 24.4 U/L (ref 5.6–51.3)

## 2017-04-04 LAB — ANION GAP: Anion Gap: 12 meq/L (ref 8.0–16.0)

## 2017-04-04 LAB — PREGNANCY, URINE: Pregnancy, Urine: NEGATIVE

## 2017-04-04 LAB — OSMOLALITY: Osmolality Calc: 271.6 mOsmol/kg — ABNORMAL LOW (ref >=275.0)

## 2017-04-04 LAB — TRICHOMONAS SCREEN: Trichomonas Prep: POSITIVE

## 2017-04-04 LAB — WET PREP, GENITAL

## 2017-04-04 LAB — GLOMERULAR FILTRATION RATE, ESTIMATED: Est, Glom Filt Rate: >90 mL/min/{1.73_m2}

## 2017-04-04 MED ORDER — CEFTRIAXONE SODIUM 250 MG IJ SOLR
250 MG | Freq: Once | INTRAMUSCULAR | Status: AC
Start: 2017-04-04 — End: 2017-04-04
  Administered 2017-04-04: 17:00:00 250 mg via INTRAMUSCULAR

## 2017-04-04 MED ORDER — SODIUM CHLORIDE 0.9 % IV BOLUS
0.9 % | Freq: Once | INTRAVENOUS | Status: AC
Start: 2017-04-04 — End: 2017-04-04
  Administered 2017-04-04: 19:00:00 1000 mL via INTRAVENOUS

## 2017-04-04 MED ORDER — AZITHROMYCIN 250 MG PO TABS
250 MG | Freq: Once | ORAL | Status: AC
Start: 2017-04-04 — End: 2017-04-04
  Administered 2017-04-04: 17:00:00 1000 mg via ORAL

## 2017-04-04 MED ORDER — DEXTROSE 5 % IV SOLN (MINI-BAG)
5 % | Freq: Once | INTRAVENOUS | Status: AC
Start: 2017-04-04 — End: 2017-04-04
  Administered 2017-04-04: 19:00:00 1 g via INTRAVENOUS

## 2017-04-04 MED ORDER — ONDANSETRON HCL 4 MG/2ML IJ SOLN
4 MG/2ML | INTRAMUSCULAR | Status: DC | PRN
Start: 2017-04-04 — End: 2017-04-04
  Administered 2017-04-04: 19:00:00 4 mg via INTRAVENOUS

## 2017-04-04 MED ORDER — METRONIDAZOLE 250 MG PO TABS
250 MG | ORAL_TABLET | Freq: Three times a day (TID) | ORAL | 0 refills | Status: AC
Start: 2017-04-04 — End: 2017-04-14

## 2017-04-04 MED ORDER — AZITHROMYCIN 250 MG PO TABS
250 MG | Freq: Once | ORAL | Status: DC
Start: 2017-04-04 — End: 2017-04-04

## 2017-04-04 MED ORDER — CLOTRIMAZOLE 1 % EX CREA
1 % | CUTANEOUS | 1 refills | Status: AC
Start: 2017-04-04 — End: 2017-04-11

## 2017-04-04 MED ORDER — FLUCONAZOLE 100 MG PO TABS
100 MG | ORAL_TABLET | Freq: Every day | ORAL | 0 refills | Status: AC
Start: 2017-04-04 — End: 2017-04-11

## 2017-04-04 MED ORDER — AMOXICILLIN-POT CLAVULANATE 875-125 MG PO TABS
875-125 MG | ORAL_TABLET | Freq: Two times a day (BID) | ORAL | 0 refills | Status: AC
Start: 2017-04-04 — End: 2017-04-14

## 2017-04-04 MED ORDER — NITROFURANTOIN MONOHYD MACRO 100 MG PO CAPS
100 MG | ORAL_CAPSULE | Freq: Two times a day (BID) | ORAL | 0 refills | Status: AC
Start: 2017-04-04 — End: 2017-04-08

## 2017-04-04 MED ORDER — KETOROLAC TROMETHAMINE 30 MG/ML IJ SOLN
30 MG/ML | Freq: Once | INTRAMUSCULAR | Status: AC
Start: 2017-04-04 — End: 2017-04-04
  Administered 2017-04-04: 19:00:00 30 mg via INTRAVENOUS

## 2017-04-04 MED FILL — DEXTROSE 5 % IV SOLN: 5 % | INTRAVENOUS | Qty: 50

## 2017-04-04 MED FILL — CEFTRIAXONE SODIUM 250 MG IJ SOLR: 250 MG | INTRAMUSCULAR | Qty: 250

## 2017-04-04 MED FILL — ONDANSETRON HCL 4 MG/2ML IJ SOLN: 4 MG/2ML | INTRAMUSCULAR | Qty: 2

## 2017-04-04 MED FILL — KETOROLAC TROMETHAMINE 30 MG/ML IJ SOLN: 30 MG/ML | INTRAMUSCULAR | Qty: 1

## 2017-04-04 MED FILL — CEFTRIAXONE SODIUM 1 G IJ SOLR: 1 g | INTRAMUSCULAR | Qty: 1

## 2017-04-04 MED FILL — XYLOCAINE-MPF 1 % IJ SOLN: 1 % | INTRAMUSCULAR | Qty: 2

## 2017-04-04 MED FILL — AZITHROMYCIN 250 MG PO TABS: 250 MG | ORAL | Qty: 4

## 2017-04-04 NOTE — ED Notes (Signed)
Patient presents to ED from Urgent Care with complaint of vaginal discharge, burning and burning with urination.  Patient states she was sent for further evaluation of UTI.     Venia MinksKaren Seydou Hearns, RN  04/04/17 1550

## 2017-04-04 NOTE — ED Provider Notes (Signed)
[facility]      CHIEF COMPLAINT       Chief Complaint   Patient presents with   . Vaginal Discharge     "white with an odor" with pain and burning with and without urination       Nurses Notes reviewed and I agree except as noted in the HPI.      HISTORY OF PRESENT ILLNESS    Jenny Ramirez is a 31 y.o. female who presents  To the ER for evaluation of flank pain with right worse than left associated with dysuria and increased  Urgency for the past two weeks but the dysuria and urgency got worse two days ago. She was seen and examined and was found to have trichomoniasis and she is on treatment for this.    Over the last two weeks patient states she has not had any fevers but today the temp at urgent care was 99.4. She has nausea but has not vomited. She has no diarrhea/abdominal distension/no jaundice. She has no melena/hematochezia or hematemesis.     Patient states that she has history of kidney stones but has never been treated for them . She has no chest symptoms and has no MSS/NEURO/DERM symptoms at this time. When she wiped today she wiped some blood but no history of hematuria.    Clinically she looks well in no acute distress. Most likely the symptomatology would suggest UTI of both lower and upper tract without a complications at this time. We shall cover with antibiotics and work up.    REVIEW OF SYSTEMS     Review of Systems   Constitutional: Negative.  Negative for chills and fever.   HENT: Negative.    Eyes: Negative.    Respiratory: Negative for chest tightness, shortness of breath and wheezing.    Cardiovascular: Negative.  Negative for chest pain, palpitations and leg swelling.   Gastrointestinal: Positive for abdominal pain and nausea. Negative for constipation, diarrhea, rectal pain and vomiting.   Genitourinary: Positive for difficulty urinating, dysuria, flank pain and vaginal discharge. Negative for hematuria, pelvic pain, urgency and vaginal bleeding.   Musculoskeletal:  Negative for back pain and myalgias.   Skin: Negative.    Neurological: Negative.         PAST MEDICAL HISTORY    has a past medical history of Abnormal Pap smear; Chlamydia; Gonorrhea; and Kidney stones.    SURGICAL HISTORY      has a past surgical history that includes knee surgery (2009); Wisdom tooth extraction (2004); fracture surgery; and Cesarean section (2015).    CURRENT MEDICATIONS       Previous Medications    IBUPROFEN (ADVIL;MOTRIN) 200 MG TABLET    Take 200 mg by mouth every 6 hours as needed for Pain       ALLERGIES     has No Known Allergies.    FAMILY HISTORY     indicated that her mother is alive. She indicated that her father is alive.    family history is not on file.    SOCIAL HISTORY      reports that she has been smoking Cigarettes.  She has a 10.00 pack-year smoking history. She has never used smokeless tobacco. She reports that she uses drugs, including Marijuana. She reports that she does not drink alcohol.    PHYSICAL EXAM     INITIAL VITALS:  weight is 178 lb 3.2 oz (80.8 kg). Her oral temperature is 98.4 F (36.9 C).  Her blood pressure is 136/79 and her pulse is 73. Her respiration is 16 and oxygen saturation is 98%.    Physical Exam   Constitutional: She is oriented to person, place, and time. She appears well-developed and well-nourished. No distress.   HENT:   Head: Normocephalic and atraumatic.   Right Ear: External ear normal.   Left Ear: External ear normal.   Nose: Nose normal.   Eyes: Conjunctivae and EOM are normal. Pupils are equal, round, and reactive to light. Right eye exhibits no discharge. Left eye exhibits no discharge. No scleral icterus.   Neck: Normal range of motion. Neck supple. No JVD present. No tracheal deviation present. No thyromegaly present.   Cardiovascular: Normal rate, regular rhythm, normal heart sounds and intact distal pulses.    No murmur heard.  Pulmonary/Chest: Effort normal and breath sounds normal. No respiratory distress. She has no wheezes. She  has no rales. She exhibits no tenderness.   Abdominal: Soft. Bowel sounds are normal. She exhibits no distension and no mass. There is no tenderness. There is no rebound and no guarding. Hernia confirmed negative in the right inguinal area and confirmed negative in the left inguinal area.   Genitourinary: There is no rash, tenderness or lesion on the right labia. There is no rash, tenderness or lesion on the left labia. There is erythema in the vagina. No tenderness or bleeding in the vagina. No foreign body in the vagina. No signs of injury around the vagina. Vaginal discharge found.   Musculoskeletal: Normal range of motion. She exhibits no edema or tenderness.   Lymphadenopathy:     She has no cervical adenopathy.        Left: No inguinal adenopathy present.   Neurological: She is alert and oriented to person, place, and time. No cranial nerve deficit. Coordination normal.   Skin: Skin is warm. No rash noted. She is not diaphoretic. No erythema. No pallor.             DIFFERENTIAL DIAGNOSIS:     Most likely UTI of upper and lower tracts    DIAGNOSTIC RESULTS     EKG: All EKG's are interpreted by the Emergency Department Physician who either signs or Co-signs this chart in the absence of a cardiologist.      RADIOLOGY: non-plain film images(s) such as CT, Ultrasound and MRI are read by the radiologist.  Plain radiographic images are visualized and preliminarily interpreted by the emergency physician unless otherwise stated below.  CT ABDOMEN PELVIS WO CONTRAST Additional Contrast? None   Final Result   1. No acute intra-abdominal or intrapelvic findings.   2. Nonobstructive bilateral punctate nephrolithiasis.   3. Findings suggestive of medullary sponge kidney.      Final report electronically signed by Dr. Crista Curb on 04/04/2017 3:14 PM          LABS:   Labs Reviewed   UA W/O MICROSCOPIC, REFLEX C & S - Abnormal; Notable for the following:        Result Value    Blood, Urine Moderate (*)     Protein, UA Trace  (*)     LEUKOCYTES, UA Moderate (*)     All other components within normal limits   COMPREHENSIVE METABOLIC PANEL W/ REFLEX TO MG FOR LOW K - Abnormal; Notable for the following:     CO2 21 (*)     All other components within normal limits   OSMOLALITY - Abnormal; Notable for the following:  Osmolality Calc 271.6 (*)     All other components within normal limits   TRICHOMONAS SCREEN   C. TRACHOMATIS / N. GONORRHOEAE, DNA   GENITAL CULTURE   URINE CULTURE, REFLEXED   CULTURE BLOOD #1   CULTURE BLOOD #2   GENITAL CULTURE   KOH (SKIN,HAIR,NAILS)   WET PREP, GENITAL   PREGNANCY, URINE   CBC WITH AUTO DIFFERENTIAL   LIPASE   LACTIC ACID, PLASMA   ANION GAP   GLOMERULAR FILTRATION RATE, ESTIMATED   URINE RT REFLEX TO CULTURE       EMERGENCY DEPARTMENT COURSE:   Vitals:    Vitals:    04/04/17 1230 04/04/17 1346 04/04/17 1438   BP: 124/77 (!) 116/55 136/79   Pulse: 89 77 73   Resp: 16 16 16    Temp: 99.2 F (37.3 C) 98.4 F (36.9 C)    TempSrc: Oral Oral    SpO2: 98% 98% 98%   Weight: 178 lb 3.2 oz (80.8 kg)       MDM    Patient presents to the emergency department with bilateral flank pain associated with dysuria ongoing for 2 weeks.  Patient's workup here in the emergency department is negative with a normal CBC and normal chemistries.  Lactic acid is negative she is got microscopic hematuria and positive for leukocyte esterase.  CT scan shows punctate kidney stones with a possible urinary sponge kidney.  She'll be treated for UTI and asked return to the emergency department if she develops a fever hematuria or nausea vomiting or any other symptoms of concern.  Pelvic exam did reveal heavy thick discharge suggestive of candida but she is already being treated for trichomoniasis.      FINAL IMPRESSION      1. Urinary tract infection without hematuria, site unspecified          DISPOSITION/PLAN     DISPOSITION Decision To Discharge    PATIENT REFERRED TO:  [facility]  [address]  [place]  [id number]  [phone]    If symptoms worsen AT any time      DISCHARGE MEDICATIONS:  New Prescriptions    AMOXICILLIN-CLAVULANATE (AUGMENTIN) 875-125 MG PER TABLET    Take 1 tablet by mouth 2 times daily for 10 days    CLOTRIMAZOLE (LOTRIMIN) 1 % CREAM    Apply topically 3 times daily to lesions on skin.    FLUCONAZOLE (DIFLUCAN) 100 MG TABLET    Take 1 tablet by mouth daily for 7 days    METRONIDAZOLE (FLAGYL) 250 MG TABLET    Take 1 tablet by mouth 3 times daily for 10 days    NITROFURANTOIN, MACROCRYSTAL-MONOHYDRATE, (MACROBID) 100 MG CAPSULE    Take 1 capsule by mouth 2 times daily for 7 doses       (Please note that portions of this note were completed with a voice recognition program.  Efforts were made to edit the dictations but occasionally words are mis-transcribed.)    Marquis LunchJOHN N Zniyah Midkiff, MD              Marquis LunchJohn N Tineshia Becraft, MD  04/04/17 1530
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
## 2017-04-04 NOTE — ED Notes (Signed)
Dr. Elsie StainKamau at bedside for pelvic exam.  Vaginal cultures x2 and chlamydia culture obtained and sent to lab.  Patient tolerates well.     Venia MinksKaren Nekia Maxham, RN  04/04/17 [phone]

## 2017-04-04 NOTE — ED Notes (Signed)
Patient  vital at discharge,Discharge assessment complete. No changes.  All discharge education and information given. Pt instructed to go to ED follow up for UTI, Verbalized Understanding. Left stable. Patient driving self, all belongs with patient. Stable condition.     Rondell ReamsSharon L Aayliah Rotenberry, LPN  16/07/9606/01/18 [phone]

## 2017-04-04 NOTE — ED Notes (Signed)
Lab at bedside, 2 sets of blood cultures obtained and confrimed.     Venia MinksKaren Vanecia Limpert, RN  04/04/17 [phone]

## 2017-04-04 NOTE — Discharge Instructions (Addendum)
Should not drink any alcohol with antibiotics.  Cultures for gonorrhea and chlamydia can take up to 3 days to results.  Should refrain from having intercourse while taking antibiotics.
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
## 2017-04-06 ENCOUNTER — Inpatient Hospital Stay
Admit: 2017-04-06 | Discharge: 2017-04-07 | Disposition: A | Discharge status: Home / Self Care | Payer: PRIVATE HEALTH INSURANCE

## 2017-04-06 DIAGNOSIS — K521 Toxic gastroenteritis and colitis: Secondary | ICD-10-CM

## 2017-04-06 LAB — CBC WITH AUTO DIFFERENTIAL
Basophils Absolute: 0 10*3/uL (ref 0.0–0.1)
Basophils: 0.2 %
Eosinophils Absolute: 0 10*3/uL (ref 0.0–0.4)
Eosinophils: 0 %
Hematocrit: 43.7 % (ref 37.0–47.0)
Hemoglobin: 15.1 gm/dl (ref 12.0–16.0)
Immature Grans (Abs): 0.06 10*3/uL (ref 0.00–0.07)
Immature Granulocytes: 0.5 %
Lymphocytes Absolute: 0.4 10*3/uL — ABNORMAL LOW (ref 1.0–4.8)
Lymphocytes: 3.1 %
MCH: 31.9 pg (ref 26.0–33.0)
MCHC: 34.6 gm/dl (ref 32.2–35.5)
MCV: 92.2 fL (ref 81.0–99.0)
MPV: 11.2 fL (ref 9.4–12.4)
Monocytes Absolute: 0.7 10*3/uL (ref 0.4–1.3)
Monocytes: 5.5 %
Platelet Estimate: ADEQUATE
Platelets: 215 10*3/uL (ref 130–400)
RBC: 4.74 10*6/uL (ref 4.20–5.40)
RDW-CV: 12.9 % (ref 11.5–14.5)
RDW-SD: 43.7 fL (ref 35.0–45.0)
Seg Neutrophils: 90.7 %
Segs Absolute: 12 10*3/uL — ABNORMAL HIGH (ref 1.8–7.7)
WBC: 13.2 10*3/uL — ABNORMAL HIGH (ref 4.8–10.8)
nRBC: 0 /100 wbc

## 2017-04-06 LAB — URINALYSIS WITH REFLEX TO CULTURE
Bilirubin Urine: NEGATIVE
Blood, Urine: NEGATIVE
Glucose, Ur: NEGATIVE mg/dl
Ketones, Urine: 40 — AB
Leukocyte Esterase, Urine: NEGATIVE
Nitrite, Urine: NEGATIVE
Protein, UA: NEGATIVE
Specific Gravity, Urine: 1.024 (ref 1.002–1.03)
Urobilinogen, Urine: 0.2 eu/dl (ref 0.0–1.0)
pH, UA: 6 (ref 5.0–9.0)

## 2017-04-06 LAB — HEPATIC FUNCTION PANEL
ALT: 13 U/L (ref 11–66)
AST: 14 U/L (ref 5–40)
Albumin: 4.1 g/dL (ref 3.5–5.1)
Alkaline Phosphatase: 63 U/L (ref 38–126)
Bilirubin, Direct: <0.2 mg/dL (ref 0.0–0.3)
Total Bilirubin: 0.7 mg/dL (ref 0.3–1.2)
Total Protein: 7.2 g/dL (ref 6.1–8.0)

## 2017-04-06 LAB — LIPASE: Lipase: 24.3 U/L (ref 5.6–51.3)

## 2017-04-06 LAB — BASIC METABOLIC PANEL
BUN: 14 mg/dL (ref 7–22)
CO2: 21 meq/L — ABNORMAL LOW (ref 23–33)
Calcium: 9.5 mg/dL (ref 8.5–10.5)
Chloride: 104 meq/L (ref 98–111)
Creatinine: 0.7 mg/dL (ref 0.4–1.2)
Glucose: 100 mg/dL (ref 70–108)
Potassium: 4 meq/L (ref 3.5–5.2)
Sodium: 141 meq/L (ref 135–145)

## 2017-04-06 LAB — GLOMERULAR FILTRATION RATE, ESTIMATED: Est, Glom Filt Rate: >90 mL/min/{1.73_m2}

## 2017-04-06 LAB — PREGNANCY, URINE: Pregnancy, Urine: NEGATIVE

## 2017-04-06 LAB — OSMOLALITY: Osmolality Calc: 281.8 mOsmol/kg (ref >=275.0)

## 2017-04-06 LAB — ANION GAP: Anion Gap: 16 meq/L (ref 8.0–16.0)

## 2017-04-06 LAB — SCAN OF BLOOD SMEAR

## 2017-04-06 MED ORDER — SODIUM CHLORIDE 0.9 % IV BOLUS
0.9 % | Freq: Once | INTRAVENOUS | Status: AC
Start: 2017-04-06 — End: 2017-04-06
  Administered 2017-04-06: 23:00:00 1000 mL via INTRAVENOUS

## 2017-04-06 MED ORDER — METOCLOPRAMIDE HCL 5 MG/ML IJ SOLN
5 MG/ML | Freq: Once | INTRAMUSCULAR | Status: AC
Start: 2017-04-06 — End: 2017-04-06
  Administered 2017-04-06: 23:00:00 10 mg via INTRAVENOUS

## 2017-04-06 MED ORDER — DIPHENHYDRAMINE HCL 50 MG/ML IJ SOLN
50 MG/ML | Freq: Once | INTRAMUSCULAR | Status: AC
Start: 2017-04-06 — End: 2017-04-06
  Administered 2017-04-06: 23:00:00 25 mg via INTRAVENOUS

## 2017-04-06 MED FILL — DIPHENHYDRAMINE HCL 50 MG/ML IJ SOLN: 50 MG/ML | INTRAMUSCULAR | Qty: 1

## 2017-04-06 MED FILL — METOCLOPRAMIDE HCL 5 MG/ML IJ SOLN: 5 MG/ML | INTRAMUSCULAR | Qty: 2

## 2017-04-06 NOTE — ED Triage Notes (Signed)
Patient presents to ER with complains of abdominal pain, nausea, vomiting, and diarrhea that began today at 0600.

## 2017-04-06 NOTE — ED Notes (Signed)
Patient presents to the emergency dept with c/o vomiting and diarrhea that started real bad about 0600 this morning. Patient states that she is currently on Flagyl for Trich and Augmentin for an UTI and since then the symptoms have become extremely bad. Patient states that her three children at home are also ill, a set of three year old twins with hand foot , mouth; one of them whom is vomiting. Patients mother who is currently watching the children is also having some vomiting and diarrhea. Patient states that her mother watches her children frequently. Patient has been taking Zofran with little improvement      Edmon CrapeRonda M Maebry Obrien, RN  04/06/17 [phone]

## 2017-04-06 NOTE — ED Provider Notes (Signed)
[facility] EMERG[facility] DEPARTMENT  Pt Name: Jenny Ramirez  MRN: [id number]  Birthdate 10-08-1985  Date of evaluation: 04/06/2017  Provider: Lamonte Sakai, APRN - CNP    CHIEF COMPLAINT       Chief Complaint   Patient presents with   ??? Nausea   ??? Diarrhea   ??? Emesis   ??? Abdominal Pain       Nurses Notes reviewed and I agree except as noted in the HPI.      HISTORY OF PRESENT ILLNESS    Jenny Ramirez is a 31 y.o. female who presents to the emergency department from home with complaints of nausea, vomiting and diarrhea.  The patient was dx with STD's and has been on augmentin and flagyl.  C/O stomach cramps, diarrhea and has vomited several times.  No fever.        Triage notes and Nursing notes were reviewed by myself.  Any discrepancies are addressed above.      REVIEW OF SYSTEMS     Review of Systems   Constitutional: Negative for chills, fatigue and fever.   HENT: Negative for congestion, ear discharge, ear pain, postnasal drip and rhinorrhea.    Respiratory: Negative for cough and chest tightness.    Cardiovascular: Negative.    Gastrointestinal: Positive for abdominal pain, diarrhea, nausea and vomiting.   Genitourinary: Negative for difficulty urinating, dysuria, enuresis, flank pain and hematuria.   Musculoskeletal: Negative for back pain and joint swelling.   Skin: Negative.    Neurological: Negative for dizziness, light-headedness, numbness and headaches.   Psychiatric/Behavioral: Negative for agitation, behavioral problems and confusion.        PAST MEDICAL HISTORY    has a past medical history of Abnormal Pap smear; Chlamydia; Gonorrhea; and Kidney stones.    SURGICAL HISTORY      has a past surgical history that includes knee surgery (2009); Wisdom tooth extraction (2004); fracture surgery; and Cesarean section (2015).    CURRENT MEDICATIONS       Discharge Medication List as of 04/06/2017  8:46 PM      CONTINUE these medications which have NOT CHANGED    Details   ibuprofen (ADVIL;MOTRIN) 200 MG  tablet Take 200 mg by mouth every 6 hours as needed for PainHistorical Med      metroNIDAZOLE (FLAGYL) 250 MG tablet Take 1 tablet by mouth 3 times daily for 10 days, Disp-30 tablet, R-0Print      clotrimazole (LOTRIMIN) 1 % cream Apply topically 3 times daily to lesions on skin., Disp-1 Tube, R-1, Print      nitrofurantoin, macrocrystal-monohydrate, (MACROBID) 100 MG capsule Take 1 capsule by mouth 2 times daily for 7 doses, Disp-14 capsule, R-0Print      amoxicillin-clavulanate (AUGMENTIN) 875-125 MG per tablet Take 1 tablet by mouth 2 times daily for 10 days, Disp-20 tablet, R-0Print      fluconazole (DIFLUCAN) 100 MG tablet Take 1 tablet by mouth daily for 7 days, Disp-7 tablet, R-0Print             ALLERGIES     has No Known Allergies.    FAMILY HISTORY     indicated that her mother is alive. She indicated that her father is alive.    family history is not on file.    SOCIAL HISTORY      reports that she has been smoking Cigarettes.  She has a 10.00 pack-year smoking history. She has never used smokeless tobacco. She reports that she uses  drugs, including Marijuana. She reports that she does not drink alcohol.    PHYSICAL EXAM     INITIAL VITALS:  height is 5\' 7"  (1.702 m) and weight is 170 lb (77.1 kg). Her oral temperature is 97.5 ??F (36.4 ??C). Her blood pressure is 118/59 (abnormal) and her pulse is 97. Her respiration is 18 and oxygen saturation is 100%.    Physical Exam   Constitutional: She is oriented to person, place, and time. She appears well-developed and well-nourished.   HENT:   Head: Normocephalic and atraumatic.   Eyes: Pupils are equal, round, and reactive to light.   Neck: Normal range of motion.   Cardiovascular: Normal rate, regular rhythm, normal heart sounds and intact distal pulses.    Pulmonary/Chest: Effort normal and breath sounds normal.   Abdominal: Soft. Bowel sounds are normal. There is tenderness (diffuse).   Musculoskeletal: Normal range of motion.   Neurological: She is alert and  oriented to person, place, and time.   Skin: Skin is warm and dry.   Psychiatric: She has a normal mood and affect. Her behavior is normal.         DIFFERENTIAL DIAGNOSIS:   Including but not limited to virus, medication reaction    DIAGNOSTIC RESULTS     EKG: All EKG's are interpreted by the Emergency Department Physician who either signs or Co-signs this chart in the absence of a cardiologist.      RADIOLOGY: non-plain film images(s) such as CT, Ultrasound and MRI are read by the radiologist.  Plain radiographic images are visualized and preliminarily interpreted by the emergency physician unless otherwise stated below.  No orders to display         LABS:   Labs Reviewed   GASTROINTESTINAL PANEL BY DNA - Abnormal; Notable for the following:        Result Value    Norovirus GI GII PCR Detected (*)     All other components within normal limits   CBC WITH AUTO DIFFERENTIAL - Abnormal; Notable for the following:     WBC 13.2 (*)     Segs Absolute 12.0 (*)     Lymphocytes # 0.4 (*)     All other components within normal limits   BASIC METABOLIC PANEL - Abnormal; Notable for the following:     CO2 21 (*)     All other components within normal limits   URINE RT REFLEX TO CULTURE - Abnormal; Notable for the following:     Ketones, Urine 40 (*)     All other components within normal limits   LIPASE   HEPATIC FUNCTION PANEL   PREGNANCY, URINE   ANION GAP   GLOMERULAR FILTRATION RATE, ESTIMATED   OSMOLALITY   SCAN OF BLOOD SMEAR         EMERGENCY DEPARTMENT COURSE AND MEDICAL DECISION MAKING:   Vitals:    Vitals:    04/06/17 1724 04/06/17 1850 04/06/17 1953   BP: 128/63 124/71 (!) 118/59   Pulse: 100 98 97   Resp: 18 18 18    Temp: 97.5 ??F (36.4 ??C)     TempSrc: Oral     SpO2: 100% 100% 100%   Weight: 170 lb (77.1 kg)     Height: 5\' 7"  (1.702 m)           Pertinent Labs & Imaging studies reviewed. (See chart for details)  She was seen and evaluated in the emergency room for nausea vomiting and diarrhea. This could be related  to the antibiotic usage. Labs are reassuring.  GI pathogens panel positive for norovirus which is a viral type of diarrhea.  This results the patient. Patient is discharged home with instructions to eat yogurt, stay hydrated, and follow up with family doctor. Patient is amenable plan of care discharged in stable condition           Strict return precautions and follow up instructions were discussed with the patient with which the patient agrees     Physical assessment findings, diagnostic testing(s) if applicable, and vital signs reviewed with patient/patient representative.  Questions answered.   Medications as directed, including OTC medications for supportive care.   Education provided on medications.  Differential diagnosis(s) discussed with patient/patient representative.  Home care/self care instructions reviewed with patient/patient representative.  Patient is to follow-up with family care provider in 2-3 days if no improvement.  Patient is to go to the emergency department if symptoms worsen.  Patient/patient representative is aware of care plan, questions answered, verbalizes understanding and is in agreement.     ED Medications administered this visit:    Medications   metoclopramide (REGLAN) injection 10 mg (10 mg Intravenous Given 04/06/17 1859)   diphenhydrAMINE (BENADRYL) injection 25 mg (25 mg Intravenous Given 04/06/17 1858)   0.9 % sodium chloride bolus (0 mLs Intravenous Stopped 04/06/17 1955)           I have given the patient strict written and verbal instructions about care at home, follow-up, and signs and symptoms of worsening of condition and they did verbalize understanding.          Patient was seen independently by myself. The Patient's final impression and disposition and plan was determined by myself.       CRITICAL CARE:   None    CONSULTS:  None    PROCEDURES:  None    FINAL IMPRESSION      1. Nausea vomiting and diarrhea    2. Antibiotic-associated diarrhea          DISPOSITION/PLAN    DISPOSITION Decision To Discharge 04/06/2017 08:44:57 PM        PATIENT REFERRED TO:  DR. Vergia Alcon [facility]  [address]  [place] [address]  [phone]    Schedule an appointment as soon as possible for a visit in 2 days  For follow up      DISCHARGE MEDICATIONS:  Discharge Medication List as of 04/06/2017  8:46 PM      START taking these medications    Details   promethazine (PHENERGAN) 25 MG suppository Place 1 suppository rectally every 6 hours as needed for Nausea WARNING:  May cause drowsiness.  May impair ability to operate vehicles or machinery.  Do not use in combination with alcohol., Disp-20 suppository, R-0Print             (Please note that portions of this note were completed with a voice recognition program.  Efforts were made to edit the dictations but occasionally words are mis-transcribed.)    Lamonte Sakai, APRN - CNP             Lamonte Sakai, APRN - CNP  04/10/17 1756

## 2017-04-06 NOTE — ED Notes (Signed)
Pt resting in bed. VSS. Respires even and unlabored. Pt states she is pain free. Call light within reach.      Sheffield SliderKevin J Sterling Mondo, RN  04/06/17 [phone]

## 2017-04-07 LAB — URINE CULTURE, REFLEXED

## 2017-04-07 LAB — GASTROINTESTINAL PANEL, MOLECULAR
Adenovirus F 40 41 PCR: NOT DETECTED
Astrovirus PCR: NOT DETECTED
Campylobacter PCR: NOT DETECTED
Clostridium difficile, PCR: NOT DETECTED
Cryptosporidium PCR: NOT DETECTED
Cyclospora Cayetanensis PCR: NOT DETECTED
E Coli Enteroaggregative PCR: NOT DETECTED
E Coli Enteropathogenic PCR: NOT DETECTED
E Coli Enterotoxigenic PCR: NOT DETECTED
E Coli Shiga Like Toxin PCR: NOT DETECTED
E Coli Shigella/Enteroinvasive PCR: NOT DETECTED
E HISTOLYTICA GI FILM ARRAY: NOT DETECTED
Giardia Lamblia PCR: NOT DETECTED
Norovirus GI GII PCR: DETECTED — AB
Plesiomonas Shigelloides PCR: NOT DETECTED
Rotavirus A PCR: NOT DETECTED
Salmonella PCR: NOT DETECTED
Sapovirus PCR: NOT DETECTED
Vibrio Cholerae PCR: NOT DETECTED
Vibrio PCR: NOT DETECTED
Yersinia Enterocolitica PCR: NOT DETECTED

## 2017-04-07 LAB — CULTURE, GENITAL: Genital Culture, Routine: NORMAL

## 2017-04-07 MED ORDER — PROMETHAZINE HCL 25 MG RE SUPP
25 MG | Freq: Four times a day (QID) | RECTAL | 0 refills | Status: AC | PRN
Start: 2017-04-07 — End: 2017-04-13

## 2017-04-08 NOTE — Telephone Encounter (Signed)
Pharmacy Note  ED Culture Follow-up    Jenny Ramirez is a 31 y.o. female.     Allergies: Patient has no known allergies.     Labs:  Lab Results   Component Value Date    BUN 14 04/06/2017    CREATININE 0.7 04/06/2017    WBC 13.2 (H) 04/06/2017     Estimated Creatinine Clearance: 127 mL/min (based on SCr of 0.7 mg/dL).    Current antimicrobials:   ?? Augmentin, fluconazole, clotrimazole, flagyl, macrobid    ASSESSMENT:  Micro results:   Urine culture: positive for citrobacter, genital cx: trichomonas, BV     PLAN:  Need for intervention: Yes  Discussed with: Dr. Lily PeerEldirani MD  Chosen treatment:    ?? DC augmentin, fluconazole, clotrimazole and instruct pt to finish course of macrobid and change flagyl to 500 mg (two tablets) twice daily for remainder    Patient response:   ?? Call attempt #1, did not reach patient    Called/sent in prescription to: Not applicable    Please call with any questions. Ext. [phone]    Jenny Ramirez, PharmD 6:26 PM 04/08/2017

## 2017-04-10 LAB — CULTURE, BLOOD 2: Blood Culture, Routine: NO GROWTH

## 2017-04-10 LAB — CULTURE BLOOD #1: Blood Culture, Routine: NO GROWTH

## 2017-07-30 ENCOUNTER — Inpatient Hospital Stay
Admit: 2017-07-30 | Discharge: 2017-07-30 | Disposition: A | Discharge status: Home / Self Care | Payer: PRIVATE HEALTH INSURANCE

## 2017-07-30 DIAGNOSIS — Z202 Contact with and (suspected) exposure to infections with a predominantly sexual mode of transmission: Secondary | ICD-10-CM

## 2017-07-30 LAB — UA W/O MICROSCOPIC, REFLEX C & S
Bilirubin Urine: NEGATIVE
Blood, Urine: NEGATIVE
Glucose, Urine: NEGATIVE mg/dl
Ketones, Urine: NEGATIVE
Nitrate, UA: NEGATIVE
Protein, UA: NEGATIVE mg/dl
Specific Gravity, UA: 1.015 (ref 1.002–1.03)
Urobilinogen, Urine: 0.2 eu/dl (ref 0.0–1.0)
pH, UA: 7 (ref 5.0–9.0)

## 2017-07-30 LAB — TRICHOMONAS SCREEN: Trichomonas Prep: POSITIVE

## 2017-07-30 LAB — PREGNANCY, URINE: Pregnancy, Urine: NEGATIVE

## 2017-07-30 MED ORDER — METRONIDAZOLE 500 MG PO TABS
500 MG | Freq: Once | ORAL | Status: AC
Start: 2017-07-30 — End: 2017-07-30
  Administered 2017-07-30: 19:00:00 2000 mg via ORAL

## 2017-07-30 MED ORDER — LIDOCAINE HCL 1% INJ (MIXTURES ONLY)
1 % | Freq: Once | INTRAMUSCULAR | Status: AC
Start: 2017-07-30 — End: 2017-07-30
  Administered 2017-07-30: 19:00:00 250 mg via INTRAMUSCULAR

## 2017-07-30 MED ORDER — AZITHROMYCIN 250 MG PO TABS
250 MG | Freq: Once | ORAL | Status: AC
Start: 2017-07-30 — End: 2017-07-30
  Administered 2017-07-30: 19:00:00 1000 mg via ORAL

## 2017-07-30 NOTE — ED Notes (Signed)
Pt calls in request STD results.  Informed negative chlamydia and negative gonorrhea. Positive trich     Theresia LoLaura Hanad Leino, RN  08/01/17 1730
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
## 2017-07-30 NOTE — ED Triage Notes (Signed)
2-3 days, vaginal discomfort/ burning senation, discharge, DX. STD 03-2017.
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
## 2017-07-31 LAB — C. TRACHOMATIS / N. GONORRHOEAE, DNA
Chlamydia Trachomatis By RT-PCR: NOT DETECTED
Neisseria Gonorrhoeae By RT-PCR: NOT DETECTED

## 2017-07-31 LAB — URINE CULTURE, REFLEXED: Urine Culture Reflex: NO GROWTH — AB

## 2017-08-02 LAB — CULTURE, GENITAL

## 2017-09-26 LAB — VAGINITIS DNA PROBE
Direct Exam: NEGATIVE
Direct Exam: POSITIVE — AB
Direct Exam: POSITIVE — AB

## 2017-09-26 LAB — WET PREP, GENITAL

## 2017-09-27 LAB — C.TRACHOMATIS N.GONORRHOEAE DNA, URINE
C. trachomatis DNA ,Urine: NEGATIVE
N. gonorrhoeae DNA, Urine: NEGATIVE

## 2018-03-22 ENCOUNTER — Other Ambulatory Visit: Payer: Self-pay

## 2018-03-22 ENCOUNTER — Emergency Department (HOSPITAL_BASED_OUTPATIENT_CLINIC_OR_DEPARTMENT_OTHER)
Admission: EM | Admit: 2018-03-22 | Discharge: 2018-03-23 | Disposition: A | Discharge status: Home / Self Care | Payer: No Typology Code available for payment source | Attending: Emergency Medicine | Admitting: Emergency Medicine

## 2018-03-22 ENCOUNTER — Encounter (HOSPITAL_BASED_OUTPATIENT_CLINIC_OR_DEPARTMENT_OTHER): Payer: Self-pay | Admitting: Emergency Medicine

## 2018-03-22 DIAGNOSIS — S39012A Strain of muscle, fascia and tendon of lower back, initial encounter: Secondary | ICD-10-CM | POA: Diagnosis not present

## 2018-03-22 DIAGNOSIS — F17210 Nicotine dependence, cigarettes, uncomplicated: Secondary | ICD-10-CM | POA: Insufficient documentation

## 2018-03-22 DIAGNOSIS — F17290 Nicotine dependence, other tobacco product, uncomplicated: Secondary | ICD-10-CM | POA: Diagnosis not present

## 2018-03-22 DIAGNOSIS — Y9389 Activity, other specified: Secondary | ICD-10-CM | POA: Diagnosis not present

## 2018-03-22 DIAGNOSIS — Y929 Unspecified place or not applicable: Secondary | ICD-10-CM | POA: Diagnosis not present

## 2018-03-22 DIAGNOSIS — S161XXA Strain of muscle, fascia and tendon at neck level, initial encounter: Secondary | ICD-10-CM | POA: Insufficient documentation

## 2018-03-22 DIAGNOSIS — Y998 Other external cause status: Secondary | ICD-10-CM | POA: Insufficient documentation

## 2018-03-22 DIAGNOSIS — R110 Nausea: Secondary | ICD-10-CM | POA: Diagnosis not present

## 2018-03-22 DIAGNOSIS — S199XXA Unspecified injury of neck, initial encounter: Secondary | ICD-10-CM | POA: Diagnosis present
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
## 2018-03-23 ENCOUNTER — Emergency Department (HOSPITAL_BASED_OUTPATIENT_CLINIC_OR_DEPARTMENT_OTHER): Payer: No Typology Code available for payment source

## 2018-03-23 MED ORDER — ACETAMINOPHEN 325 MG PO TABS
650.0000 mg | ORAL_TABLET | Freq: Once | ORAL | Status: AC
  Administered 2018-03-23: 650 mg via ORAL
  Filled 2018-03-23: qty 2

## 2018-03-23 MED ORDER — CYCLOBENZAPRINE HCL 10 MG PO TABS
10.0000 mg | ORAL_TABLET | Freq: Once | ORAL | Status: AC
  Administered 2018-03-23: 10 mg via ORAL
  Filled 2018-03-23: qty 1

## 2018-03-23 MED ORDER — CYCLOBENZAPRINE HCL 10 MG PO TABS: 10.0000 mg | ORAL_TABLET | Freq: Three times a day (TID) | ORAL | 0 refills | Status: AC | PRN

## 2018-03-23 MED ORDER — IBUPROFEN 800 MG PO TABS
800.0000 mg | ORAL_TABLET | Freq: Once | ORAL | Status: AC
  Administered 2018-03-23: 800 mg via ORAL
  Filled 2018-03-23: qty 1

## 2018-03-23 NOTE — Discharge Instructions (Signed)
Apply ice several times a day. Take ibuprofen or naproxen as needed for pain. You may add acetaminophen for additional pain relief.
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
## 2018-03-23 NOTE — ED Notes (Signed)
Patient transported to X-ray 

## 2018-03-23 NOTE — ED Notes (Signed)
Pt reports she is unable to give urine sample at this time. Denies chance of pregnancy and reports she will sign waiver in order to do imaging.

## 2018-07-22 ENCOUNTER — Encounter: Admit: 2018-07-22
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
## 2023-08-10 ENCOUNTER — Inpatient Hospital Stay: Admit: 2023-08-23 | Payer: MEDICARE
